# Patient Record
Sex: Female | Born: 1950 | Race: Black or African American | Hispanic: No | Marital: Married | Smoking: Never smoker
Health system: Southern US, Community
[De-identification: ages and names within clinical notes are randomized; demographics above are authoritative.]

## PROBLEM LIST (undated history)

## (undated) DIAGNOSIS — T7840XA Allergy, unspecified, initial encounter: Secondary | ICD-10-CM

## (undated) DIAGNOSIS — M109 Gout, unspecified: Secondary | ICD-10-CM

## (undated) DIAGNOSIS — G4730 Sleep apnea, unspecified: Secondary | ICD-10-CM

## (undated) DIAGNOSIS — J3489 Other specified disorders of nose and nasal sinuses: Secondary | ICD-10-CM

## (undated) DIAGNOSIS — E669 Obesity, unspecified: Secondary | ICD-10-CM

## (undated) DIAGNOSIS — I10 Essential (primary) hypertension: Secondary | ICD-10-CM

## (undated) DIAGNOSIS — E119 Type 2 diabetes mellitus without complications: Secondary | ICD-10-CM

## (undated) HISTORY — PX: KNEE SURGERY: SHX244

## (undated) HISTORY — DX: Obesity, unspecified: E66.9

## (undated) HISTORY — DX: Sleep apnea, unspecified: G47.30

## (undated) HISTORY — DX: Gout, unspecified: M10.9

## (undated) HISTORY — PX: NOSE SURGERY: SHX723

## (undated) HISTORY — DX: Other specified disorders of nose and nasal sinuses: J34.89

## (undated) HISTORY — DX: Allergy, unspecified, initial encounter: T78.40XA

## (undated) HISTORY — PX: COLONOSCOPY: SHX174

## (undated) HISTORY — DX: Essential (primary) hypertension: I10

## (undated) HISTORY — DX: Type 2 diabetes mellitus without complications: E11.9

---

## 1997-08-11 ENCOUNTER — Other Ambulatory Visit: Admission: RE | Admit: 1997-08-11 | Discharge: 1997-08-11 | Payer: Self-pay | Admitting: Obstetrics and Gynecology

## 1998-02-09 ENCOUNTER — Other Ambulatory Visit: Admission: RE | Admit: 1998-02-09 | Discharge: 1998-02-09 | Payer: Self-pay | Admitting: Obstetrics and Gynecology

## 1999-02-15 ENCOUNTER — Other Ambulatory Visit: Admission: RE | Admit: 1999-02-15 | Discharge: 1999-02-15 | Payer: Self-pay | Admitting: Obstetrics and Gynecology

## 2000-03-06 ENCOUNTER — Other Ambulatory Visit: Admission: RE | Admit: 2000-03-06 | Discharge: 2000-03-06 | Payer: Self-pay | Admitting: Obstetrics and Gynecology

## 2000-07-03 ENCOUNTER — Ambulatory Visit (HOSPITAL_COMMUNITY): Admission: RE | Admit: 2000-07-03 | Discharge: 2000-07-03 | Payer: Self-pay | Admitting: Obstetrics and Gynecology

## 2001-04-16 ENCOUNTER — Other Ambulatory Visit: Admission: RE | Admit: 2001-04-16 | Discharge: 2001-04-16 | Payer: Self-pay | Admitting: Obstetrics and Gynecology

## 2002-07-22 ENCOUNTER — Other Ambulatory Visit: Admission: RE | Admit: 2002-07-22 | Discharge: 2002-07-22 | Payer: Self-pay | Admitting: Obstetrics and Gynecology

## 2003-09-16 ENCOUNTER — Other Ambulatory Visit: Admission: RE | Admit: 2003-09-16 | Discharge: 2003-09-16 | Payer: Self-pay | Admitting: Obstetrics and Gynecology

## 2004-10-12 ENCOUNTER — Other Ambulatory Visit: Admission: RE | Admit: 2004-10-12 | Discharge: 2004-10-12 | Payer: Self-pay | Admitting: Obstetrics and Gynecology

## 2005-11-28 ENCOUNTER — Emergency Department (HOSPITAL_COMMUNITY): Admission: EM | Admit: 2005-11-28 | Discharge: 2005-11-28 | Payer: Self-pay | Admitting: Family Medicine

## 2007-11-06 ENCOUNTER — Encounter: Admission: RE | Admit: 2007-11-06 | Discharge: 2007-11-06 | Payer: Self-pay | Admitting: Orthopedic Surgery

## 2009-05-19 ENCOUNTER — Encounter: Admission: RE | Admit: 2009-05-19 | Discharge: 2009-05-19 | Payer: Self-pay | Admitting: Obstetrics and Gynecology

## 2010-02-04 ENCOUNTER — Encounter: Admission: RE | Admit: 2010-02-04 | Discharge: 2010-02-04 | Payer: Self-pay | Admitting: Obstetrics and Gynecology

## 2010-05-03 ENCOUNTER — Emergency Department (HOSPITAL_BASED_OUTPATIENT_CLINIC_OR_DEPARTMENT_OTHER)
Admission: EM | Admit: 2010-05-03 | Discharge: 2010-05-04 | Payer: Self-pay | Source: Home / Self Care | Admitting: Emergency Medicine

## 2010-05-05 LAB — BASIC METABOLIC PANEL
BUN: 13 mg/dL (ref 6–23)
CO2: 31 mEq/L (ref 19–32)
Calcium: 9.2 mg/dL (ref 8.4–10.5)
Chloride: 100 mEq/L (ref 96–112)
Creatinine, Ser: 0.9 mg/dL (ref 0.4–1.2)
GFR calc Af Amer: >60 mL/min (ref >60)
GFR calc non Af Amer: >60 mL/min (ref >60)
Glucose, Bld: 180 mg/dL — ABNORMAL HIGH (ref 70–99)
Potassium: 3.2 mEq/L — ABNORMAL LOW (ref 3.5–5.1)
Sodium: 144 mEq/L (ref 135–145)

## 2010-05-05 LAB — CBC
HCT: 39.3 % (ref 36.0–46.0)
Hemoglobin: 13.3 g/dL (ref 12.0–15.0)
MCH: 28.5 pg (ref 26.0–34.0)
MCHC: 33.8 g/dL (ref 30.0–36.0)
MCV: 84.3 fL (ref 78.0–100.0)
Platelets: 338 10*3/uL (ref 150–400)
RBC: 4.66 MIL/uL (ref 3.87–5.11)
RDW: 12.5 % (ref 11.5–15.5)
WBC: 7.5 10*3/uL (ref 4.0–10.5)

## 2010-09-03 NOTE — H&P (Signed)
[facility]  Patient:    Wendy Ochoa, Wendy Ochoa                       MRN: [id number] Adm. Date:  07/03/00 Attending:  Juluis Mire, M.D.                         History and Physical  HISTORY OF PRESENT ILLNESS:          The patient is a 60 year old gravida 1, para 1, postmenopausal black female who presents for hysteroscopic evaluation.  In relation to the present admission, the patient has a history of amenorrhea. Because of this, she had undergone previous ultrasound and endometrial sampling in 1998. Findings at that time were negative. Because of continued abnormal bleeding, the patient underwent a repeat ultrasound. She had a very thickened endometrium and questionable endometrial polyp. A follow-up saline infusion ultrasound confirmed the presence of multiple polyps. She presents now for hysteroscopic removal and resection.  ALLERGIES:                    The patient has no known drug allergies.  CURRENT MEDICATIONS:          ______ .  PAST MEDICAL HISTORY:         Significant for history of hypertension for which it is controlled nicely with medication. Otherwise, usual childhood diseases without any significant sequelae.  PAST SURGICAL HISTORY:        She has had previous knee surgery. No other surgery noted. She has had one previous spontaneous vaginal delivery.  FAMILY HISTORY:               Noncontributory.  SOCIAL HISTORY:               No tobacco or alcohol use.  REVIEW OF SYSTEMS:            Noncontributory.  PHYSICAL EXAMINATION:  VITAL SIGNS:                  The patient afebrile. Stable vital signs.  HEENT:                        The patient normocephalic. Pupils equal, round, and reactive to light and accommodation. Extraocular movements were intact. Sclerae and conjunctivae clear. Oropharynx clear.  NECK:                         Without thyromegaly.  BREASTS:                      Not examined.  LUNGS:                         Clear.  CARDIAC:                      Regular rate. No murmurs or gallops.  ABDOMEN:                      Benign.  PELVIC:                       Normal external genitalia. Vaginal mucosa is clear. Cervix unremarkable. Uterus normal size and shape and contour. Adnexa free of masses or tenderness.  IMPRESSION:  Endometrial polyps. Rule out endometrial pathology.  PLAN:                         The patient to undergo hysteroscopic evaluation and resection. The risks of surgery have been discussed, including the risks of anesthesia, the risk of infection, the risk of hemorrhage, ______ transfusion with the risk of AIDS or hepatitis, and possible hysterectomy, the risk of uterine perforation that could lead to injury to adjacent organs, ______ laparoscopy and exploratory laparotomy, the risk of DV ______ pulmonary embolus. The patient appears to understand indication and risks. DD: 07/03/00 TD:  07/03/00 Job: [id number]

## 2011-07-07 ENCOUNTER — Other Ambulatory Visit: Payer: Self-pay | Admitting: Obstetrics and Gynecology

## 2011-07-07 DIAGNOSIS — R928 Other abnormal and inconclusive findings on diagnostic imaging of breast: Secondary | ICD-10-CM

## 2011-07-11 ENCOUNTER — Ambulatory Visit
Admission: RE | Admit: 2011-07-11 | Discharge: 2011-07-11 | Disposition: A | Discharge status: Home / Self Care | Payer: BC Managed Care – PPO | Source: Ambulatory Visit | Attending: Obstetrics and Gynecology | Admitting: Obstetrics and Gynecology

## 2011-07-11 DIAGNOSIS — R928 Other abnormal and inconclusive findings on diagnostic imaging of breast: Secondary | ICD-10-CM

## 2012-07-13 ENCOUNTER — Encounter: Payer: Self-pay | Admitting: Internal Medicine

## 2012-09-04 ENCOUNTER — Encounter: Payer: Self-pay | Admitting: Internal Medicine

## 2012-09-04 ENCOUNTER — Ambulatory Visit (AMBULATORY_SURGERY_CENTER): Payer: BC Managed Care – PPO

## 2012-09-04 VITALS — Ht 66.0 in | Wt 235.6 lb

## 2012-09-04 DIAGNOSIS — Z1211 Encounter for screening for malignant neoplasm of colon: Secondary | ICD-10-CM

## 2012-09-04 MED ORDER — MOVIPREP 100 G PO SOLR: ORAL | Status: DC

## 2012-09-18 ENCOUNTER — Encounter: Payer: Self-pay | Admitting: Internal Medicine

## 2012-09-18 ENCOUNTER — Ambulatory Visit (AMBULATORY_SURGERY_CENTER): Payer: BC Managed Care – PPO | Admitting: Internal Medicine

## 2012-09-18 VITALS — BP 111/62 | HR 59 | Temp 97.0°F | Resp 68 | Ht 66.0 in | Wt 235.0 lb

## 2012-09-18 DIAGNOSIS — Z1211 Encounter for screening for malignant neoplasm of colon: Secondary | ICD-10-CM

## 2012-09-18 MED ORDER — SODIUM CHLORIDE 0.9 % IV SOLN: 500.0000 mL | INTRAVENOUS | Status: DC

## 2012-09-18 NOTE — Progress Notes (Signed)
Lidocaine-40mg IV prior to Propofol InductionPropofol given over incremental dosages 

## 2012-09-18 NOTE — Patient Instructions (Addendum)
Discharge instructions given with verbal understanding. Normal exam. resume previous medications. YOU HAD AN ENDOSCOPIC PROCEDURE TODAY AT THE [facility]: Refer to the procedure report that was given to you for any specific questions about what was found during the examination.  If the procedure report does not answer your questions, please call your gastroenterologist to clarify.  If you requested that your care partner not be given the details of your procedure findings, then the procedure report has been included in a sealed envelope for you to review at your convenience later.  YOU SHOULD EXPECT: Some feelings of bloating in the abdomen. Passage of more gas than usual.  Walking can help get rid of the air that was put into your GI tract during the procedure and reduce the bloating. If you had a lower endoscopy (such as a colonoscopy or flexible sigmoidoscopy) you may notice spotting of blood in your stool or on the toilet paper. If you underwent a bowel prep for your procedure, then you may not have a normal bowel movement for a few days.  DIET: Your first meal following the procedure should be a light meal and then it is ok to progress to your normal diet.  A half-sandwich or bowl of soup is an example of a good first meal.  Heavy or fried foods are harder to digest and may make you feel nauseous or bloated.  Likewise meals heavy in dairy and vegetables can cause extra gas to form and this can also increase the bloating.  Drink plenty of fluids but you should avoid alcoholic beverages for 24 hours.  ACTIVITY: Your care partner should take you home directly after the procedure.  You should plan to take it easy, moving slowly for the rest of the day.  You can resume normal activity the day after the procedure however you should NOT DRIVE or use heavy machinery for 24 hours (because of the sedation medicines used during the test).    SYMPTOMS TO REPORT IMMEDIATELY: A gastroenterologist  can be reached at any hour.  During normal business hours, 8:30 AM to 5:00 PM Monday through Friday, call [phone].  After hours and on weekends, please call the GI answering service at [phone] who will take a message and have the physician on call contact you.   Following lower endoscopy (colonoscopy or flexible sigmoidoscopy):  Excessive amounts of blood in the stool  Significant tenderness or worsening of abdominal pains  Swelling of the abdomen that is new, acute  Fever of 100F or higher FOLLOW UP: If any biopsies were taken you will be contacted by phone or by letter within the next 1-3 weeks.  Call your gastroenterologist if you have not heard about the biopsies in 3 weeks.  Our staff will call the home number listed on your records the next business day following your procedure to check on you and address any questions or concerns that you may have at that time regarding the information given to you following your procedure. This is a courtesy call and so if there is no answer at the home number and we have not heard from you through the emergency physician on call, we will assume that you have returned to your regular daily activities without incident.  SIGNATURES/CONFIDENTIALITY: You and/or your care partner have signed paperwork which will be entered into your electronic medical record.  These signatures attest to the fact that that the information above on your After Visit Summary has been reviewed and  is understood.  Full responsibility of the confidentiality of this discharge information lies with you and/or your care-partner.

## 2012-09-18 NOTE — Op Note (Addendum)
[facility] [address] [place], [address]   COLONOSCOPY PROCEDURE REPORT  PATIENT: Wendy, Ochoa  MR#: [id number] BIRTHDATE: May 25, 1950 , 61  yrs. old GENDER: Female ENDOSCOPIST: Hart Carwin, MD REFERRED BY:  Deatra James, M.D. , Dr Shela Commons.McComb PROCEDURE DATE:  09/18/2012 PROCEDURE:       screening colonoscopy ASA CLASS:   II INDICATIONS:screening, average risk, last colonoscopy 2004 MEDICATIONS: MAC, Propofol 250 mg IV  DESCRIPTION OF PROCEDURE:   After the risks and benefits and of the procedure were explained, informed consent was obtained.        The LB [id number]  endoscope was introduced through the anus and advanced to the    .  The quality of the prep was    .  The instrument was then slowly withdrawn as the colon was fully examined.   Normal exam,, no diverticuli or polyps, [   The scope was then withdrawn from the patient and the procedure completed.  COMPLICATIONS: There were no complications. ENDOSCOPIC IMPRESSION: normal colonoscopy to the cecum RECOMMENDATIONS: High fiber diet  REPEAT EXAM: recall colonoscopy 10 years  cc:  _______________________________ eSignedHart Carwin, MD 09/18/2012 9:55 AM Revised: 09/18/2012 9:55 AM

## 2012-09-18 NOTE — Progress Notes (Signed)
Ambulated in recovery room times five laps. Stated i feel better. Expel moderate amount on air while in recovery. Stated ready to go home.

## 2012-09-18 NOTE — Progress Notes (Signed)
Patient did not experience any of the following events: a burn prior to discharge; a fall within the facility; wrong site/side/patient/procedure/implant event; or a hospital transfer or hospital admission upon discharge from the facility. (G8907) Patient did not have preoperative order for IV antibiotic SSI prophylaxis. (G8918)  

## 2012-09-19 ENCOUNTER — Telehealth: Payer: Self-pay | Admitting: *Deleted

## 2012-09-19 NOTE — Telephone Encounter (Signed)
  Follow up Call-  Call back number 09/18/2012  Post procedure Call Back phone  # [phone]  Permission to leave phone message Yes     Patient questions:  Do you have a fever, pain , or abdominal swelling? no Pain Score  0 *  Have you tolerated food without any problems? yes  Have you been able to return to your normal activities? yes  Do you have any questions about your discharge instructions: Diet   no Medications  no Follow up visit  no  Do you have questions or concerns about your Care? no  Actions: * If pain score is 4 or above: No action needed, pain <4.

## 2013-02-12 ENCOUNTER — Inpatient Hospital Stay (HOSPITAL_COMMUNITY)
Admission: EM | Admit: 2013-02-12 | Discharge: 2013-02-14 | DRG: 287 | Disposition: A | Discharge status: Home / Self Care | Payer: BC Managed Care – PPO | Attending: Internal Medicine | Admitting: Internal Medicine

## 2013-02-12 ENCOUNTER — Emergency Department (HOSPITAL_COMMUNITY): Payer: BC Managed Care – PPO

## 2013-02-12 ENCOUNTER — Encounter (HOSPITAL_COMMUNITY): Payer: Self-pay | Admitting: Emergency Medicine

## 2013-02-12 DIAGNOSIS — R079 Chest pain, unspecified: Principal | ICD-10-CM | POA: Diagnosis present

## 2013-02-12 DIAGNOSIS — R9439 Abnormal result of other cardiovascular function study: Secondary | ICD-10-CM | POA: Diagnosis present

## 2013-02-12 DIAGNOSIS — E876 Hypokalemia: Secondary | ICD-10-CM | POA: Diagnosis present

## 2013-02-12 DIAGNOSIS — E781 Pure hyperglyceridemia: Secondary | ICD-10-CM | POA: Diagnosis present

## 2013-02-12 DIAGNOSIS — E669 Obesity, unspecified: Secondary | ICD-10-CM | POA: Diagnosis present

## 2013-02-12 DIAGNOSIS — R001 Bradycardia, unspecified: Secondary | ICD-10-CM

## 2013-02-12 DIAGNOSIS — M549 Dorsalgia, unspecified: Secondary | ICD-10-CM | POA: Diagnosis present

## 2013-02-12 DIAGNOSIS — IMO0002 Reserved for concepts with insufficient information to code with codable children: Secondary | ICD-10-CM

## 2013-02-12 DIAGNOSIS — I10 Essential (primary) hypertension: Secondary | ICD-10-CM | POA: Diagnosis present

## 2013-02-12 DIAGNOSIS — G4733 Obstructive sleep apnea (adult) (pediatric): Secondary | ICD-10-CM | POA: Diagnosis present

## 2013-02-12 DIAGNOSIS — Z79899 Other long term (current) drug therapy: Secondary | ICD-10-CM

## 2013-02-12 DIAGNOSIS — M109 Gout, unspecified: Secondary | ICD-10-CM | POA: Diagnosis present

## 2013-02-12 LAB — CBC
HCT: 43.4 % (ref 36.0–46.0)
Hemoglobin: 14.7 g/dL (ref 12.0–15.0)
MCH: 29.6 pg (ref 26.0–34.0)
MCV: 87.5 fL (ref 78.0–100.0)
RDW: 13.2 % (ref 11.5–15.5)
WBC: 12.8 10*3/uL — ABNORMAL HIGH (ref 4.0–10.5)

## 2013-02-12 LAB — CBC WITH DIFFERENTIAL/PLATELET
Basophils Absolute: 0 10*3/uL (ref 0.0–0.1)
Basophils Relative: 0 % (ref 0–1)
Eosinophils Absolute: 0.1 10*3/uL (ref 0.0–0.7)
Eosinophils Relative: 1 % (ref 0–5)
HCT: 42.9 % (ref 36.0–46.0)
Hemoglobin: 14.8 g/dL (ref 12.0–15.0)
Lymphocytes Relative: 54 % — ABNORMAL HIGH (ref 12–46)
Lymphs Abs: 7.8 10*3/uL — ABNORMAL HIGH (ref 0.7–4.0)
MCH: 29.9 pg (ref 26.0–34.0)
MCHC: 34.5 g/dL (ref 30.0–36.0)
MCV: 86.7 fL (ref 78.0–100.0)
Monocytes Absolute: 0.9 10*3/uL (ref 0.1–1.0)
Monocytes Relative: 6 % (ref 3–12)
Neutro Abs: 5.7 10*3/uL (ref 1.7–7.7)
Neutrophils Relative %: 39 % — ABNORMAL LOW (ref 43–77)
Platelets: 349 10*3/uL (ref 150–400)
RBC: 4.95 MIL/uL (ref 3.87–5.11)
RDW: 13.1 % (ref 11.5–15.5)
WBC: 14.5 10*3/uL — ABNORMAL HIGH (ref 4.0–10.5)

## 2013-02-12 LAB — POCT I-STAT, CHEM 8
BUN: 18 mg/dL (ref 6–23)
Calcium, Ion: 1.19 mmol/L (ref 1.13–1.30)
Chloride: 96 mEq/L (ref 96–112)
HCT: 48 % — ABNORMAL HIGH (ref 36.0–46.0)
Potassium: 2.9 mEq/L — ABNORMAL LOW (ref 3.5–5.1)
Sodium: 143 mEq/L (ref 135–145)
TCO2: 31 mmol/L (ref 0–100)

## 2013-02-12 LAB — LIPID PANEL
Cholesterol: 173 mg/dL (ref 0–200)
HDL: 37 mg/dL — ABNORMAL LOW (ref >39)
LDL Cholesterol: 98 mg/dL (ref 0–99)

## 2013-02-12 LAB — CREATININE, SERUM
Creatinine, Ser: 0.93 mg/dL (ref 0.50–1.10)
GFR calc Af Amer: 75 mL/min — ABNORMAL LOW (ref >90)

## 2013-02-12 LAB — POCT I-STAT TROPONIN I: Troponin i, poc: 0.01 ng/mL (ref 0.00–0.08)

## 2013-02-12 LAB — PROTIME-INR
INR: 0.97 (ref 0.00–1.49)
Prothrombin Time: 12.7 seconds (ref 11.6–15.2)

## 2013-02-12 LAB — POTASSIUM: Potassium: 2.7 mEq/L — CL (ref 3.5–5.1)

## 2013-02-12 MED ORDER — SODIUM CHLORIDE 0.9 % IV SOLN
INTRAVENOUS | Status: DC
  Administered 2013-02-12: 22:00:00 via INTRAVENOUS

## 2013-02-12 MED ORDER — ACETAMINOPHEN 325 MG PO TABS: 650.0000 mg | ORAL_TABLET | ORAL | Status: DC | PRN

## 2013-02-12 MED ORDER — ALLOPURINOL 100 MG PO TABS
100.0000 mg | ORAL_TABLET | Freq: Every day | ORAL | Status: DC | PRN
  Filled 2013-02-12: qty 1

## 2013-02-12 MED ORDER — POLYVINYL ALCOHOL 1.4 % OP SOLN
1.0000 [drp] | Freq: Four times a day (QID) | OPHTHALMIC | Status: DC
  Administered 2013-02-14: 1 [drp] via OPHTHALMIC
  Filled 2013-02-12: qty 15

## 2013-02-12 MED ORDER — CHLORTHALIDONE 25 MG PO TABS
25.0000 mg | ORAL_TABLET | Freq: Every day | ORAL | Status: DC
  Administered 2013-02-13 – 2013-02-14 (×2): 25 mg via ORAL
  Filled 2013-02-12 (×2): qty 1

## 2013-02-12 MED ORDER — POTASSIUM CHLORIDE 10 MEQ/100ML IV SOLN
10.0000 meq | INTRAVENOUS | Status: AC
  Administered 2013-02-12 – 2013-02-13 (×3): 10 meq via INTRAVENOUS
  Filled 2013-02-12 (×3): qty 100

## 2013-02-12 MED ORDER — ATENOLOL 50 MG PO TABS
50.0000 mg | ORAL_TABLET | Freq: Every day | ORAL | Status: DC
  Administered 2013-02-13 – 2013-02-14 (×2): 50 mg via ORAL
  Filled 2013-02-12 (×2): qty 1

## 2013-02-12 MED ORDER — ASPIRIN 300 MG RE SUPP
300.0000 mg | RECTAL | Status: AC
  Filled 2013-02-12: qty 1

## 2013-02-12 MED ORDER — COLCHICINE 0.6 MG PO TABS
0.6000 mg | ORAL_TABLET | Freq: Every day | ORAL | Status: DC | PRN
  Filled 2013-02-12: qty 1

## 2013-02-12 MED ORDER — ATENOLOL-CHLORTHALIDONE 50-25 MG PO TABS: 1.0000 | ORAL_TABLET | Freq: Every day | ORAL | Status: DC

## 2013-02-12 MED ORDER — ONDANSETRON HCL 4 MG/2ML IJ SOLN: 4.0000 mg | Freq: Four times a day (QID) | INTRAMUSCULAR | Status: DC | PRN

## 2013-02-12 MED ORDER — ONDANSETRON HCL 4 MG/2ML IJ SOLN: 4.0000 mg | Freq: Three times a day (TID) | INTRAMUSCULAR | Status: AC | PRN

## 2013-02-12 MED ORDER — MOMETASONE FURO-FORMOTEROL FUM 100-5 MCG/ACT IN AERO
2.0000 | INHALATION_SPRAY | Freq: Two times a day (BID) | RESPIRATORY_TRACT | Status: DC
  Administered 2013-02-12 – 2013-02-13 (×3): 2 via RESPIRATORY_TRACT
  Filled 2013-02-12: qty 8.8

## 2013-02-12 MED ORDER — ASPIRIN EC 81 MG PO TBEC
81.0000 mg | DELAYED_RELEASE_TABLET | Freq: Every day | ORAL | Status: DC
  Administered 2013-02-13 – 2013-02-14 (×2): 81 mg via ORAL
  Filled 2013-02-12 (×2): qty 1

## 2013-02-12 MED ORDER — POTASSIUM CHLORIDE CRYS ER 20 MEQ PO TBCR
40.0000 meq | EXTENDED_RELEASE_TABLET | Freq: Once | ORAL | Status: AC
  Administered 2013-02-12: 40 meq via ORAL
  Filled 2013-02-12: qty 2

## 2013-02-12 MED ORDER — NITROGLYCERIN 0.4 MG SL SUBL: 0.4000 mg | SUBLINGUAL_TABLET | SUBLINGUAL | Status: DC | PRN

## 2013-02-12 MED ORDER — CARBOXYMETHYLCELLUL-GLYCERIN 1-0.25 % OP SOLN: 1.0000 [drp] | Freq: Four times a day (QID) | OPHTHALMIC | Status: DC

## 2013-02-12 MED ORDER — ENOXAPARIN SODIUM 40 MG/0.4ML ~~LOC~~ SOLN
40.0000 mg | SUBCUTANEOUS | Status: DC
  Administered 2013-02-12 – 2013-02-13 (×2): 40 mg via SUBCUTANEOUS
  Filled 2013-02-12 (×3): qty 0.4

## 2013-02-12 MED ORDER — ASPIRIN 81 MG PO CHEW: 324.0000 mg | CHEWABLE_TABLET | ORAL | Status: AC

## 2013-02-12 NOTE — Progress Notes (Signed)
Pt refuses to wear CPAP for tonight. Pt does not wear every night at home. No distress noted. Pt encouraged to call RT if pt changes mind.
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
## 2013-02-12 NOTE — ED Notes (Addendum)
Pt reports mid sternum chest pressure x1 week, seen at UC diagnosed with Bronchitis prescribed a loading dose of Prednisone ans Zithromax. Pt reports she began experiencing mid-sternum chest pressure that radiated to her back when coughing, unable to catch her breath when lying flat states it would cause increase pressure, weakness, and non productive cough on Saturday evening that has continued to get progressively worse. Pt seen at her PCP today, they sent her here for further evaluation due to no old EKG

## 2013-02-12 NOTE — ED Provider Notes (Addendum)
62 year old female, history of hypertension, history of family member with early cardiac disease and early cardiac death at age 22 from a massive heart attack, both parents with heart disease. She presents at the request of her physician from the physician's office with complaint of intermittent heaviness on her chest, she is obese, lungs are clear, heart is regular, soft murmur, no peripheral edema. Her EKG shows sinus bradycardia with no signs of significant ischemia, the patient is on a beta blocker. Due to her intermittent symptoms, heaviness sensation she will need admission to the hospital for cardiac evaluation and rule out. She has never had any cardiac testing in the past.  Troponin negative, discussed with Dr. Joseph Art of the hospitalist service will admit.    Medical screening examination/treatment/procedure(s) were conducted as a shared visit with non-physician practitioner(s) and myself.  I personally evaluated the patient during the encounter.  Clinical Impression:  Chest pain      Vida Roller, MD 02/12/13 1415  Vida Roller, MD 02/12/13 [phone]

## 2013-02-12 NOTE — ED Notes (Signed)
Pt arrived by [facility] from MD office with c/o CP. Denies any n/v but stated that she did have a little bit of SOB. Pt has received ASA324mg  and Nitro x1. Pt was initially 2/10 pain and after Nitro is now free from pain. Pt has had bronchitis and is now finishing up tx for that. 12 lead showed Sinus Huston Foley.

## 2013-02-12 NOTE — Plan of Care (Signed)
Problem: Phase I Progression Outcomes Goal: Aspirin unless contraindicated Outcome: Completed/Met Date Met:  02/12/13 Pt reports taking pta

## 2013-02-13 DIAGNOSIS — R072 Precordial pain: Secondary | ICD-10-CM

## 2013-02-13 LAB — TROPONIN I
Troponin I: <0.3 ng/mL
Troponin I: <0.3 ng/mL

## 2013-02-13 LAB — COMPREHENSIVE METABOLIC PANEL
ALT: 11 U/L (ref 0–35)
AST: 13 U/L (ref 0–37)
Albumin: 3.1 g/dL — ABNORMAL LOW (ref 3.5–5.2)
Alkaline Phosphatase: 104 U/L (ref 39–117)
Calcium: 8.9 mg/dL (ref 8.4–10.5)
Creatinine, Ser: 0.97 mg/dL (ref 0.50–1.10)
GFR calc Af Amer: 72 mL/min — ABNORMAL LOW (ref >90)
Glucose, Bld: 109 mg/dL — ABNORMAL HIGH (ref 70–99)
Potassium: 3.3 mEq/L — ABNORMAL LOW (ref 3.5–5.1)
Sodium: 139 mEq/L (ref 135–145)
Total Protein: 7.7 g/dL (ref 6.0–8.3)

## 2013-02-13 LAB — TSH: TSH: 0.965 u[IU]/mL (ref 0.350–4.500)

## 2013-02-13 LAB — MAGNESIUM: Magnesium: 1.8 mg/dL (ref 1.5–2.5)

## 2013-02-13 LAB — PRO B NATRIURETIC PEPTIDE: Pro B Natriuretic peptide (BNP): 65.4 pg/mL (ref 0–125)

## 2013-02-13 LAB — HEMOGLOBIN A1C
Hgb A1c MFr Bld: 6.5 % — ABNORMAL HIGH
Mean Plasma Glucose: 140 mg/dL — ABNORMAL HIGH

## 2013-02-13 LAB — T4, FREE: Free T4: 1.29 ng/dL (ref 0.80–1.80)

## 2013-02-13 MED ORDER — POTASSIUM CHLORIDE CRYS ER 20 MEQ PO TBCR
40.0000 meq | EXTENDED_RELEASE_TABLET | Freq: Once | ORAL | Status: AC
  Administered 2013-02-13: 40 meq via ORAL
  Filled 2013-02-13: qty 2

## 2013-02-13 MED ORDER — PANTOPRAZOLE SODIUM 40 MG PO TBEC
40.0000 mg | DELAYED_RELEASE_TABLET | Freq: Two times a day (BID) | ORAL | Status: DC
  Administered 2013-02-13 – 2013-02-14 (×2): 40 mg via ORAL
  Filled 2013-02-13 (×2): qty 1
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
## 2013-02-13 NOTE — Progress Notes (Signed)
[facility] PROGRESS NOTE  JUN RIGHTMYER GNF:[id number] DOB: Feb 11, 1951 DOA: 02/12/2013 PCP: Leanor Rubenstein, MD  Assessment/Plan: 1. Chest pain; pressure, patient with typical symptoms, multiple risk factors age, history of hypertension, obesity. EKG with T wave abnormalities, junctional rhythm. I have consulted cardio for evaluation for impatient stress test. Continue with aspirin, nitroglycerin. Will add protonix.  2. Hypokalemia: replete with 40 meq times 2.  3. Hypertension: continue with atenolol, Chlorthalidone.   Code Status: Full Code.  Family Communication: care discussed with patient.  Disposition Plan: home when stable.    Consultants:  Cardio  Procedures:  ECHO pending.   Antibiotics:  none  HPI/Subjective: Patient relates chest pressure for last week. Chest pressure is worse when she ly down.  She was treated last week for bronchitis.  She denies chest pain on exertion.   Objective: Filed Vitals:   02/13/13 1252  BP: 136/62  Pulse: 69  Temp: 97.9 F (36.6 C)  Resp: 16    Intake/Output Summary (Last 24 hours) at 02/13/13 1432 Last data filed at 02/13/13 1200  Gross per 24 hour  Intake    360 ml  Output      0 ml  Net    360 ml   Filed Weights   02/12/13 2151  Weight: 108.001 kg (238 lb 1.6 oz)    Exam:   General:  No distress.   Cardiovascular: S 1, S 2 RRR  Respiratory: CTA  Abdomen: BS present, soft, NT  Musculoskeletal: no edema.   Data Reviewed: Basic Metabolic Panel:  Recent Labs Lab 02/12/13 1536 02/12/13 2117 02/12/13 2220 02/13/13 0610  NA 143  --   --  139  K 2.9*  --  2.7* 3.3*  CL 96  --   --  99  CO2  --   --   --  31  GLUCOSE 81  --   --  109*  BUN 18  --   --  16  CREATININE 1.20* 0.93  --  0.97  CALCIUM  --   --   --  8.9  MG  --   --  1.8 1.8   Liver Function Tests:  Recent Labs Lab 02/13/13 0610  AST 13  ALT 11  ALKPHOS 104  BILITOT 0.5  PROT 7.7  ALBUMIN 3.1*   No results found for  this basename: LIPASE, AMYLASE,  in the last 168 hours No results found for this basename: AMMONIA,  in the last 168 hours CBC:  Recent Labs Lab 02/12/13 1343 02/12/13 1536 02/12/13 2117  WBC 14.5*  --  12.8*  NEUTROABS 5.7  --   --   HGB 14.8 16.3* 14.7  HCT 42.9 48.0* 43.4  MCV 86.7  --  87.5  PLT 349  --  358   Cardiac Enzymes:  Recent Labs Lab 02/12/13 2300 02/13/13 0610 02/13/13 1205  TROPONINI <0.30 <0.30 <0.30   BNP (last 3 results)  Recent Labs  02/12/13 2300  PROBNP 65.4   CBG: No results found for this basename: GLUCAP,  in the last 168 hours  No results found for this or any previous visit (from the past 240 hour(s)).   Studies: Dg Chest Portable 1 View  02/12/2013   CLINICAL DATA:  Chest pressure, left upper back pain, shortness of breath, cough  EXAM: PORTABLE CHEST - 1 VIEW  COMPARISON:  05/03/2010  FINDINGS: The heart size and mediastinal contours are within normal limits. Both lungs are clear. The visualized skeletal structures are unremarkable.  IMPRESSION: No active disease.   Electronically Signed   By: Ruel Favors M.D.   On: 02/12/2013 14:32    Scheduled Meds: . aspirin  324 mg Oral NOW   Or  . aspirin  300 mg Rectal NOW  . aspirin EC  81 mg Oral Daily  . atenolol  50 mg Oral Daily   And  . chlorthalidone  25 mg Oral Daily  . enoxaparin (LOVENOX) injection  40 mg Subcutaneous Q24H  . mometasone-formoterol  2 puff Inhalation BID  . polyvinyl alcohol  1 drop Both Eyes QID   Continuous Infusions:   Principal Problem:   Chest pain Active Problems:   OSA on CPAP   Gout   HTN (hypertension)    Time spent: 25 minutes.     ,  [facility] Pager [phone]. If 7PM-7AM, please contact night-coverage at [email or web address], password [facility] 02/13/2013, 2:32 PM  LOS: 1 day

## 2013-02-13 NOTE — Progress Notes (Signed)
*  PRELIMINARY RESULTS* Echocardiogram 2D Echocardiogram has been performed.  Wendy Ochoa 02/13/2013, 3:07 PM

## 2013-02-14 ENCOUNTER — Inpatient Hospital Stay (HOSPITAL_COMMUNITY): Payer: BC Managed Care – PPO

## 2013-02-14 ENCOUNTER — Encounter (HOSPITAL_COMMUNITY)
Admission: EM | Disposition: A | Discharge status: Home / Self Care | Payer: Self-pay | Source: Home / Self Care | Attending: Internal Medicine

## 2013-02-14 DIAGNOSIS — R079 Chest pain, unspecified: Secondary | ICD-10-CM

## 2013-02-14 DIAGNOSIS — R9439 Abnormal result of other cardiovascular function study: Secondary | ICD-10-CM | POA: Diagnosis present

## 2013-02-14 HISTORY — PX: LEFT HEART CATHETERIZATION WITH CORONARY ANGIOGRAM: SHX5451

## 2013-02-14 LAB — COMPREHENSIVE METABOLIC PANEL
Alkaline Phosphatase: 96 U/L (ref 39–117)
BUN: 16 mg/dL (ref 6–23)
Calcium: 8.7 mg/dL (ref 8.4–10.5)
Chloride: 99 mEq/L (ref 96–112)
GFR calc Af Amer: 69 mL/min — ABNORMAL LOW (ref >90)
Glucose, Bld: 140 mg/dL — ABNORMAL HIGH (ref 70–99)
Potassium: 3.8 mEq/L (ref 3.5–5.1)
Total Bilirubin: 0.5 mg/dL (ref 0.3–1.2)
Total Protein: 6.9 g/dL (ref 6.0–8.3)

## 2013-02-14 LAB — MAGNESIUM: Magnesium: 1.9 mg/dL (ref 1.5–2.5)

## 2013-02-14 SURGERY — LEFT HEART CATHETERIZATION WITH CORONARY ANGIOGRAM
Anesthesia: LOCAL

## 2013-02-14 MED ORDER — HEPARIN SODIUM (PORCINE) 1000 UNIT/ML IJ SOLN
INTRAMUSCULAR | Status: AC
  Filled 2013-02-14: qty 1

## 2013-02-14 MED ORDER — SODIUM CHLORIDE 0.9 % IJ SOLN: 3.0000 mL | INTRAMUSCULAR | Status: DC | PRN

## 2013-02-14 MED ORDER — ACETAMINOPHEN 325 MG PO TABS: 650.0000 mg | ORAL_TABLET | ORAL | Status: DC | PRN

## 2013-02-14 MED ORDER — HEPARIN (PORCINE) IN NACL 2-0.9 UNIT/ML-% IJ SOLN
INTRAMUSCULAR | Status: AC
  Filled 2013-02-14: qty 1000

## 2013-02-14 MED ORDER — REGADENOSON 0.4 MG/5ML IV SOLN
0.4000 mg | Freq: Once | INTRAVENOUS | Status: AC
  Administered 2013-02-14: 0.4 mg via INTRAVENOUS

## 2013-02-14 MED ORDER — FENTANYL CITRATE 0.05 MG/ML IJ SOLN
INTRAMUSCULAR | Status: AC
  Filled 2013-02-14: qty 2

## 2013-02-14 MED ORDER — REGADENOSON 0.4 MG/5ML IV SOLN
INTRAVENOUS | Status: AC
  Administered 2013-02-14: 10:00:00
  Filled 2013-02-14: qty 5

## 2013-02-14 MED ORDER — MIDAZOLAM HCL 2 MG/2ML IJ SOLN
INTRAMUSCULAR | Status: AC
  Filled 2013-02-14: qty 2

## 2013-02-14 MED ORDER — PANTOPRAZOLE SODIUM 40 MG PO TBEC: 40.0000 mg | DELAYED_RELEASE_TABLET | Freq: Two times a day (BID) | ORAL | Status: DC

## 2013-02-14 MED ORDER — SODIUM CHLORIDE 0.9 % IV SOLN: 250.0000 mL | INTRAVENOUS | Status: DC | PRN

## 2013-02-14 MED ORDER — ASPIRIN 81 MG PO TBEC: 81.0000 mg | DELAYED_RELEASE_TABLET | Freq: Every day | ORAL | Status: DC

## 2013-02-14 MED ORDER — NITROGLYCERIN 0.2 MG/ML ON CALL CATH LAB
INTRAVENOUS | Status: AC
  Filled 2013-02-14: qty 1

## 2013-02-14 MED ORDER — TECHNETIUM TC 99M SESTAMIBI GENERIC - CARDIOLITE
30.0000 | Freq: Once | INTRAVENOUS | Status: AC | PRN
  Administered 2013-02-14: 30 via INTRAVENOUS

## 2013-02-14 MED ORDER — VERAPAMIL HCL 2.5 MG/ML IV SOLN
INTRAVENOUS | Status: AC
  Filled 2013-02-14: qty 2

## 2013-02-14 MED ORDER — TECHNETIUM TC 99M SESTAMIBI GENERIC - CARDIOLITE
10.0000 | Freq: Once | INTRAVENOUS | Status: AC | PRN
  Administered 2013-02-14: 10 via INTRAVENOUS

## 2013-02-14 MED ORDER — SODIUM CHLORIDE 0.9 % IJ SOLN
3.0000 mL | Freq: Two times a day (BID) | INTRAMUSCULAR | Status: DC
  Administered 2013-02-14: 3 mL via INTRAVENOUS

## 2013-02-14 MED ORDER — ONDANSETRON HCL 4 MG/2ML IJ SOLN: 4.0000 mg | Freq: Four times a day (QID) | INTRAMUSCULAR | Status: DC | PRN

## 2013-02-14 MED ORDER — SODIUM CHLORIDE 0.9 % IV SOLN
INTRAVENOUS | Status: DC
  Administered 2013-02-14: 14:00:00 via INTRAVENOUS

## 2013-02-14 MED ORDER — LIDOCAINE HCL (PF) 1 % IJ SOLN
INTRAMUSCULAR | Status: AC
  Filled 2013-02-14: qty 30

## 2013-02-14 NOTE — Interval H&P Note (Signed)
Cath Lab Visit (complete for each Cath Lab visit)  Clinical Evaluation Leading to the Procedure:   ACS: no  Non-ACS:    Anginal Classification: CCS II  Anti-ischemic medical therapy: No Therapy  Non-Invasive Test Results: Low-risk stress test findings: cardiac mortality <1%/year  Prior CABG: No previous CABG      History and Physical Interval Note:  02/14/2013 4:32 PM  Leslyn C Fason  has presented today for surgery, with the diagnosis of Abnormal Nuc.  The various methods of treatment have been discussed with the patient and family. After consideration of risks, benefits and other options for treatment, the patient has consented to  Procedure(s): LEFT HEART CATHETERIZATION WITH CORONARY ANGIOGRAM (N/A) as a surgical intervention .  The patient's history has been reviewed, patient examined, no change in status, stable for surgery.  I have reviewed the patient's chart and labs.  Questions were answered to the patient's satisfaction.     Lesleigh Noe

## 2013-02-14 NOTE — CV Procedure (Signed)
     Left Heart Catheterization with Coronary Angiography  Report  Wendy Ochoa  62 y.o.  female Sep 14, 1950  Procedure Date: 02/14/2013  Referring Physician: Lonia Farber, MD Primary Cardiologist:: J.Varanasi, MD  INDICATIONS: The recurring episodes of chest pressure occurring at rest without EKG or enzyme marker abnormality. Low to intermediate risk abnormal myocardial perfusion study earlier today suggesting apical ischemia.  PROCEDURE: 1. Left heart catheterization; 2. Coronary angiography; 3. Left ventriculography.  CONSENT:  The risks, benefits, and details of the procedure were explained in detail to the patient. Risks including death, stroke, heart attack, kidney injury, allergy, limb ischemia, bleeding and radiation injury were discussed.  The patient verbalized understanding and wanted to proceed.  Informed written consent was obtained.  PROCEDURE TECHNIQUE:  After Xylocaine anesthesia a 5 French Slender sheath was placed in the right radial artery with a single anterior needle wall stick.  Coronary angiography was done using a 5 [place] JR 4 and JL 3.5 cm catheter.  Left ventriculography was done using the JR 4 catheter and hand injection.   No difficulties encountered during the procedure.  MEDICATIONS: Versed 1 mg; fentanyl 50 mcg; aspirin.   EQUIPMENT: Diagnostic 5 French Judkins 4 cm right and 3.5 cm left. Versed 4 steerable guidewire and 300 cm tight J-wire.  CONTRAST:  Total of 60 cc.  COMPLICATIONS:  None   HEMODYNAMICS:  Aortic pressure 142/67; LV pressure 147/7; LVEDP 13  ANGIOGRAPHIC DATA:   The left main coronary artery is normal.  The left anterior descending artery is transapical and normal distal tortuosity noted.  The left circumflex artery is normal.  The right coronary artery is normal.   LEFT VENTRICULOGRAM:  Left ventricular angiogram was done in the 30 RAO projection and revealed no regional wall motion abnormality. EF 65%.   IMPRESSIONS:   1. Widely patent coronary arteries including the LAD which supplies the apex.  2. Normal left ventricular systolic function  3. False positive myocardial perfusion study with the apical perfusion abnormality likely secondary to breast attenuation   RECOMMENDATION:  Home later tonight if no problems with radial access site.Marland Kitchen

## 2013-02-14 NOTE — ED Provider Notes (Signed)
Medical screening examination/treatment/procedure(s) were conducted as a shared visit with non-physician practitioner(s) and myself.  I personally evaluated the patient during the encounter  Please see my separate respective documentation pertaining to this patient encounter   Vida Roller, MD 02/14/13 1524

## 2013-02-14 NOTE — Progress Notes (Signed)
[facility] PROGRESS NOTE  Wendy Ochoa FAO:[id number] DOB: [birth year] DOA: 02/12/2013 PCP: Leanor Rubenstein, MD  Assessment/Plan: 1. Chest pain; pressure, patient with typical symptoms, multiple risk factors age, history of hypertension, obesity. EKG with T wave abnormalities, junctional rhythm.  Continue with aspirin, nitroglycerin,  protonix. Myoview with perfusion defect. For cath today.  2. Hypokalemia: resolved.  3. Hypertension: continue with atenolol, Chlorthalidone.   Code Status: Full Code.  Family Communication: care discussed with patient.  Disposition Plan: home when stable.    Consultants:  Cardio  Procedures:  ECHO pending.   Antibiotics:  none  HPI/Subjective: Feeling better. Some fullness after she eats.   Objective: Filed Vitals:   02/14/13 1423  BP: 145/67  Pulse: 63  Temp: 97.9 F (36.6 C)  Resp: 16    Intake/Output Summary (Last 24 hours) at 02/14/13 1639 Last data filed at 02/14/13 1200  Gross per 24 hour  Intake    360 ml  Output      0 ml  Net    360 ml   Filed Weights   02/12/13 2151  Weight: 108.001 kg (238 lb 1.6 oz)    Exam:   General:  No distress.   Cardiovascular: S 1, S 2 RRR  Respiratory: CTA  Abdomen: BS present, soft, NT  Musculoskeletal: no edema.   Data Reviewed: Basic Metabolic Panel:  Recent Labs Lab 02/12/13 1536 02/12/13 2117 02/12/13 2220 02/13/13 0610 02/14/13 0401  NA 143  --   --  139 138  K 2.9*  --  2.7* 3.3* 3.8  CL 96  --   --  99 99  CO2  --   --   --  31 31  GLUCOSE 81  --   --  109* 140*  BUN 18  --   --  16 16  CREATININE 1.20* 0.93  --  0.97 1.00  CALCIUM  --   --   --  8.9 8.7  MG  --   --  1.8 1.8 1.9   Liver Function Tests:  Recent Labs Lab 02/13/13 0610 02/14/13 0401  AST 13 10  ALT 11 10  ALKPHOS 104 96  BILITOT 0.5 0.5  PROT 7.7 6.9  ALBUMIN 3.1* 2.7*   No results found for this basename: LIPASE, AMYLASE,  in the last 168 hours No results found for this  basename: AMMONIA,  in the last 168 hours CBC:  Recent Labs Lab 02/12/13 1343 02/12/13 1536 02/12/13 2117  WBC 14.5*  --  12.8*  NEUTROABS 5.7  --   --   HGB 14.8 16.3* 14.7  HCT 42.9 48.0* 43.4  MCV 86.7  --  87.5  PLT 349  --  358   Cardiac Enzymes:  Recent Labs Lab 02/12/13 2300 02/13/13 0610 02/13/13 1205  TROPONINI <0.30 <0.30 <0.30   BNP (last 3 results)  Recent Labs  02/12/13 2300  PROBNP 65.4   CBG: No results found for this basename: GLUCAP,  in the last 168 hours  No results found for this or any previous visit (from the past 240 hour(s)).   Studies: Nm Myocar Multi W/spect W/wall Motion / Ef  02/14/2013   CLINICAL DATA:  Chest pain. History of hypertension and gout.  The  EXAM: MYOCARDIAL IMAGING WITH SPECT (REST AND PHARMACOLOGIC-STRESS)  GATED LEFT VENTRICULAR WALL MOTION STUDY  LEFT VENTRICULAR EJECTION FRACTION  TECHNIQUE: Standard myocardial SPECT imaging was performed after resting intravenous injection of 10 mCi Tc-60m sestamibi. Subsequently, intravenous infusion of Lexiscan  was performed under the supervision of the Cardiology staff. At peak effect of the drug, 30 mCi Tc-60m sestamibi was injected intravenously and standard myocardial SPECT imaging was performed. Quantitative gated imaging was also performed to evaluate left ventricular wall motion, and estimate left ventricular ejection fraction.  COMPARISON:  Chest CTA 05/03/2010.  FINDINGS: SPECT images demonstrated mildly decreased activity within the apex and inferior wall. This appears largely fixed, although a small amount of reversibility at the apex cannot be excluded. The summed different score is 5.  The gated images were reviewed and demonstrate normal left ventricular wall motion and systolic thickening. The QGS ejection fraction calculated at rest is 60% with an end-diastolic volume of 82 ml and an end-systolic volume of 33ml.  IMPRESSION: 1. Largely fixed perfusion defect at the apex and  inferior wall may be related to myocardial thinning. However, a small amount of reversibility at the apex is difficult to exclude. No other reversible perfusion defects. 2. Normal wall motion study with calculated ejection fraction of 60%.   Electronically Signed   By: Roxy Horseman M.D.   On: 02/14/2013 12:19    Scheduled Meds: . [facility] HOLD] aspirin EC  81 mg Oral Daily  . [facility] HOLD] atenolol  50 mg Oral Daily   And  . [MAR HOLD] chlorthalidone  25 mg Oral Daily  . [MAR HOLD] enoxaparin (LOVENOX) injection  40 mg Subcutaneous Q24H  . [MAR HOLD] mometasone-formoterol  2 puff Inhalation BID  . [MAR HOLD] pantoprazole  40 mg Oral BID WC  . [facility] HOLD] polyvinyl alcohol  1 drop Both Eyes QID  . sodium chloride  3 mL Intravenous Q12H   Continuous Infusions: . sodium chloride 125 mL/hr at 02/14/13 1429    Principal Problem:   Chest pain Active Problems:   OSA on CPAP   Gout   HTN (hypertension)    Time spent: 25 minutes.     ,  [facility] Pager [phone]. If 7PM-7AM, please contact night-coverage at [email or web address], password [facility] 02/14/2013, 4:39 PM  LOS: 2 days

## 2013-02-14 NOTE — Progress Notes (Signed)
Pt given discharge instructions with understanding with husband at bedside. Pt has no questions at this time. Iv and monitor d/c. Pt wheeled out via wheelchair by staff.

## 2013-02-14 NOTE — H&P (View-Only) (Signed)
       Patient Name: Wendy Ochoa Date of Encounter: 02/14/2013    SUBJECTIVE: Completed the nuclear study and awaiting the results. Denies chest pain. Has no dyspnea. Has a sense of chest fullness when she swallows.  TELEMETRY:  NSR: Filed Vitals:   02/14/13 0941 02/14/13 0944 02/14/13 0946 02/14/13 0948  BP: 152/71 166/53 122/57 129/69  Pulse: 60 72 81 79  Temp:      TempSrc:      Resp:      Height:      Weight:      SpO2:        Intake/Output Summary (Last 24 hours) at 02/14/13 1141 Last data filed at 02/13/13 1200  Gross per 24 hour  Intake    360 ml  Output      0 ml  Net    360 ml    LABS: Basic Metabolic Panel:  Recent Labs  16/10/96 0610 02/14/13 0401  NA 139 138  K 3.3* 3.8  CL 99 99  CO2 31 31  GLUCOSE 109* 140*  BUN 16 16  CREATININE 0.97 1.00  CALCIUM 8.9 8.7  MG 1.8 1.9   CBC:  Recent Labs  02/12/13 1343 02/12/13 1536 02/12/13 2117  WBC 14.5*  --  12.8*  NEUTROABS 5.7  --   --   HGB 14.8 16.3* 14.7  HCT 42.9 48.0* 43.4  MCV 86.7  --  87.5  PLT 349  --  358   Cardiac Enzymes:  Recent Labs  02/12/13 2300 02/13/13 0610 02/13/13 1205  TROPONINI <0.30 <0.30 <0.30   BNP    Component Value Date/Time   PROBNP 65.4 02/12/2013 2300    Hemoglobin A1C:  Recent Labs  02/12/13 2117  HGBA1C 6.5*   Fasting Lipid Panel:  Recent Labs  02/12/13 2117  CHOL 173  HDL 37*  LDLCALC 98  TRIG 045*  CHOLHDL 4.7    Radiology/Studies:  Nuclear perfusion study is pending  Physical Exam: Blood pressure 129/69, pulse 79, temperature 98.3 F (36.8 C), temperature source Oral, resp. rate 16, height 5\' 6"  (1.676 m), weight 238 lb 1.6 oz (108.001 kg), SpO2 94.00%. Weight change:    S4 gallop  ASSESSMENT:  1. Chest pain with no evidence MI and pending nuclear. There may be a GI component to the pain. Back discomfort also raises the possibility of aortic discomfort.  Plan:  No fuerther ischemic w/u if nuclear is low risk. If  discomfort persists, consider GI and aortic origin  Selinda Eon 02/14/2013, 11:41 AM

## 2013-02-14 NOTE — Discharge Summary (Signed)
Physician Discharge Summary  [name] NFA:[id number] DOB: 10/22/1950 DOA: 02/12/2013  PCP: Leanor Rubenstein, MD  Admit date: 02/12/2013 Discharge date: 02/14/2013  Time spent: 35 minutes  Recommendations for Outpatient Follow-up:  1. FU with PCP, if chest pain persist she will need GI evaluation.   Discharge Diagnoses:    Chest pain, negative Cath, question GI in origin.    OSA on CPAP   Gout   HTN (hypertension)   Discharge Condition: Stable  Diet recommendation: Heart HEalthy  Filed Weights   02/12/13 2151  Weight: 108.001 kg (238 lb 1.6 oz)    History of present illness:  Wendy Ochoa 62 yo BF PMHx HTN , OSA (on CPAP setting unk), Gout, benign left breast calcifications FMHx Early cardiac disease and early cardiac death; Brother died age 10 from a massive heart attack, both parents with heart disease. She presents at the request of her physician from the physician's office with complaint of intermittent heaviness on her chest. States chest pain started approximately 2 weeks ago unprovoked, substernal, negative radiation, negative diaphoresis, negative N./V., rated 5/10. The result spontaneously. States last serious episode was Saturday which lasted for approximately 24 hours into Sunday. Her EKG shows sinus bradycardia with no signs of significant ischemia, the patient is on a beta blocker. Due to her intermittent symptoms, heaviness sensation she will need admission to the hospital for cardiac evaluation and rule out. She has never had any cardiac testing in the past. Currently (-) CP.    Hospital Course:  Chest pain; pressure, patient with typical and Atypical symptoms, multiple risk factors age, history of hypertension, obesity. EKG with T wave abnormalities, junctional rhythm. Continue with aspirin, nitroglycerin, protonix. Myoview with perfusion defect. Cath show normal CAD. She will need GI evaluation if symptoms persist. Patient discharge on protonix.  Hypokalemia:  resolved.  Hypertension: continue with atenolol, Chlorthalidone   Procedures: Cath; normal,Widely patent coronary arteries including the LAD which supplies the apex.  2. Normal left ventricular systolic function  3. False positive myocardial perfusion study with the apical perfusion abnormality likely secondary to breast attenuation    Myoview: 1. Largely fixed perfusion defect at the apex and inferior wall may  be related to myocardial thinning. However, a small amount of  reversibility at the apex is difficult to exclude. No other  reversible perfusion defects.  2. Normal wall motion study with calculated ejection fraction of  60%    Consultations:  Dr Katrinka Blazing  Discharge Exam: Filed Vitals:   02/14/13 1617  BP:   Pulse: 58  Temp:   Resp:     General: No distress.  Cardiovascular: S 1, S 2 RRR Respiratory: CTA  Discharge Instructions  Discharge Orders   Future Orders Complete By Expires   Diet - low sodium heart healthy  As directed    Increase activity slowly  As directed        Medication List    STOP taking these medications       naproxen sodium 220 MG tablet  Commonly known as:  ANAPROX      TAKE these medications       allopurinol 100 MG tablet  Commonly known as:  ZYLOPRIM  Take 100 mg by mouth daily as needed (gout).     aspirin 81 MG EC tablet  Take 1 tablet (81 mg total) by mouth daily.     atenolol-chlorthalidone 50-25 MG per tablet  Commonly known as:  TENORETIC  Take 1 tablet by mouth daily.  CLEAR EYES FOR DRY EYES OP  Apply 1 drop to eye 4 (four) times daily as needed (dry eyes).     colchicine 0.6 MG tablet  Take 0.6 mg by mouth daily as needed (gout).     DULERA 100-5 MCG/ACT Aero  Generic drug:  mometasone-formoterol  Inhale 2 puffs into the lungs as needed for wheezing.     pantoprazole 40 MG tablet  Commonly known as:  PROTONIX  Take 1 tablet (40 mg total) by mouth 2 (two) times daily with a meal.     predniSONE 20  MG tablet  Commonly known as:  DELTASONE  Take 20 mg by mouth daily. Dose pack. Scheduled to finish tomorrow 02/13/13       Allergies  Allergen Reactions  . Benicar [Olmesartan]     coughing  . Lisinopril     coughing      The results of significant diagnostics from this hospitalization (including imaging, microbiology, ancillary and laboratory) are listed below for reference.    Significant Diagnostic Studies: Nm Myocar Multi W/spect W/wall Motion / Ef  02/14/2013   CLINICAL DATA:  Chest pain. History of hypertension and gout.  The  EXAM: MYOCARDIAL IMAGING WITH SPECT (REST AND PHARMACOLOGIC-STRESS)  GATED LEFT VENTRICULAR WALL MOTION STUDY  LEFT VENTRICULAR EJECTION FRACTION  TECHNIQUE: Standard myocardial SPECT imaging was performed after resting intravenous injection of 10 mCi Tc-84m sestamibi. Subsequently, intravenous infusion of Lexiscan was performed under the supervision of the Cardiology staff. At peak effect of the drug, 30 mCi Tc-27m sestamibi was injected intravenously and standard myocardial SPECT imaging was performed. Quantitative gated imaging was also performed to evaluate left ventricular wall motion, and estimate left ventricular ejection fraction.  COMPARISON:  Chest CTA 05/03/2010.  FINDINGS: SPECT images demonstrated mildly decreased activity within the apex and inferior wall. This appears largely fixed, although a small amount of reversibility at the apex cannot be excluded. The summed different score is 5.  The gated images were reviewed and demonstrate normal left ventricular wall motion and systolic thickening. The QGS ejection fraction calculated at rest is 60% with an end-diastolic volume of 82 ml and an end-systolic volume of 33ml.  IMPRESSION: 1. Largely fixed perfusion defect at the apex and inferior wall may be related to myocardial thinning. However, a small amount of reversibility at the apex is difficult to exclude. No other reversible perfusion defects. 2.  Normal wall motion study with calculated ejection fraction of 60%.   Electronically Signed   By: Roxy Horseman M.D.   On: 02/14/2013 12:19   Dg Chest Portable 1 View  02/12/2013   CLINICAL DATA:  Chest pressure, left upper back pain, shortness of breath, cough  EXAM: PORTABLE CHEST - 1 VIEW  COMPARISON:  05/03/2010  FINDINGS: The heart size and mediastinal contours are within normal limits. Both lungs are clear. The visualized skeletal structures are unremarkable.  IMPRESSION: No active disease.   Electronically Signed   By: Ruel Favors M.D.   On: 02/12/2013 14:32    Microbiology: No results found for this or any previous visit (from the past 240 hour(s)).   Labs: Basic Metabolic Panel:  Recent Labs Lab 02/12/13 1536 02/12/13 2117 02/12/13 2220 02/13/13 0610 02/14/13 0401  NA 143  --   --  139 138  K 2.9*  --  2.7* 3.3* 3.8  CL 96  --   --  99 99  CO2  --   --   --  31 31  GLUCOSE 81  --   --  109* 140*  BUN 18  --   --  16 16  CREATININE 1.20* 0.93  --  0.97 1.00  CALCIUM  --   --   --  8.9 8.7  MG  --   --  1.8 1.8 1.9   Liver Function Tests:  Recent Labs Lab 02/13/13 0610 02/14/13 0401  AST 13 10  ALT 11 10  ALKPHOS 104 96  BILITOT 0.5 0.5  PROT 7.7 6.9  ALBUMIN 3.1* 2.7*   No results found for this basename: LIPASE, AMYLASE,  in the last 168 hours No results found for this basename: AMMONIA,  in the last 168 hours CBC:  Recent Labs Lab 02/12/13 1343 02/12/13 1536 02/12/13 2117  WBC 14.5*  --  12.8*  NEUTROABS 5.7  --   --   HGB 14.8 16.3* 14.7  HCT 42.9 48.0* 43.4  MCV 86.7  --  87.5  PLT 349  --  358   Cardiac Enzymes:  Recent Labs Lab 02/12/13 2300 02/13/13 0610 02/13/13 1205  TROPONINI <0.30 <0.30 <0.30   BNP: BNP (last 3 results)  Recent Labs  02/12/13 2300  PROBNP 65.4   CBG: No results found for this basename: GLUCAP,  in the last 168 hours     Signed:  ,  [facility] 02/14/2013, 5:10 PM

## 2013-02-14 NOTE — Progress Notes (Signed)
       Patient Name: Wendy Ochoa Date of Encounter: 02/14/2013    SUBJECTIVE: Completed the nuclear study and awaiting the results. Denies chest pain. Has no dyspnea. Has a sense of chest fullness when she swallows.  TELEMETRY:  NSR: Filed Vitals:   02/14/13 0941 02/14/13 0944 02/14/13 0946 02/14/13 0948  BP: 152/71 166/53 122/57 129/69  Pulse: 60 72 81 79  Temp:      TempSrc:      Resp:      Height:      Weight:      SpO2:        Intake/Output Summary (Last 24 hours) at 02/14/13 1141 Last data filed at 02/13/13 1200  Gross per 24 hour  Intake    360 ml  Output      0 ml  Net    360 ml    LABS: Basic Metabolic Panel:  Recent Labs  02/13/13 0610 02/14/13 0401  NA 139 138  K 3.3* 3.8  CL 99 99  CO2 31 31  GLUCOSE 109* 140*  BUN 16 16  CREATININE 0.97 1.00  CALCIUM 8.9 8.7  MG 1.8 1.9   CBC:  Recent Labs  02/12/13 1343 02/12/13 1536 02/12/13 2117  WBC 14.5*  --  12.8*  NEUTROABS 5.7  --   --   HGB 14.8 16.3* 14.7  HCT 42.9 48.0* 43.4  MCV 86.7  --  87.5  PLT 349  --  358   Cardiac Enzymes:  Recent Labs  02/12/13 2300 02/13/13 0610 02/13/13 1205  TROPONINI <0.30 <0.30 <0.30   BNP    Component Value Date/Time   PROBNP 65.4 02/12/2013 2300    Hemoglobin A1C:  Recent Labs  02/12/13 2117  HGBA1C 6.5*   Fasting Lipid Panel:  Recent Labs  02/12/13 2117  CHOL 173  HDL 37*  LDLCALC 98  TRIG 189*  CHOLHDL 4.7    Radiology/Studies:  Nuclear perfusion study is pending  Physical Exam: Blood pressure 129/69, pulse 79, temperature 98.3 F (36.8 C), temperature source Oral, resp. rate 16, height 5' 6" (1.676 m), weight 238 lb 1.6 oz (108.001 kg), SpO2 94.00%. Weight change:    S4 gallop  ASSESSMENT:  1. Chest pain with no evidence MI and pending nuclear. There may be a GI component to the pain. Back discomfort also raises the possibility of aortic discomfort.  Plan:  No fuerther ischemic w/u if nuclear is low risk. If  discomfort persists, consider GI and aortic origin  Signed, SMITH III,HENRY W 02/14/2013, 11:41 AM  

## 2013-04-03 ENCOUNTER — Emergency Department (INDEPENDENT_AMBULATORY_CARE_PROVIDER_SITE_OTHER): Payer: BC Managed Care – PPO

## 2013-04-03 ENCOUNTER — Emergency Department (HOSPITAL_COMMUNITY): Payer: BC Managed Care – PPO

## 2013-04-03 ENCOUNTER — Emergency Department (INDEPENDENT_AMBULATORY_CARE_PROVIDER_SITE_OTHER)
Admission: EM | Admit: 2013-04-03 | Discharge: 2013-04-03 | Disposition: A | Discharge status: Home / Self Care | Payer: BC Managed Care – PPO | Source: Home / Self Care

## 2013-04-03 ENCOUNTER — Encounter (HOSPITAL_COMMUNITY): Payer: Self-pay | Admitting: Emergency Medicine

## 2013-04-03 DIAGNOSIS — R262 Difficulty in walking, not elsewhere classified: Secondary | ICD-10-CM

## 2013-04-03 DIAGNOSIS — M175 Other unilateral secondary osteoarthritis of knee: Secondary | ICD-10-CM

## 2013-04-03 DIAGNOSIS — M222X2 Patellofemoral disorders, left knee: Secondary | ICD-10-CM

## 2013-04-03 DIAGNOSIS — M1732 Unilateral post-traumatic osteoarthritis, left knee: Secondary | ICD-10-CM

## 2013-04-03 DIAGNOSIS — M25569 Pain in unspecified knee: Secondary | ICD-10-CM

## 2013-04-03 MED ORDER — TRAMADOL HCL 50 MG PO TABS: 50.0000 mg | ORAL_TABLET | Freq: Four times a day (QID) | ORAL | Status: DC | PRN

## 2013-04-03 MED ORDER — NAPROXEN 375 MG PO TABS: 375.0000 mg | ORAL_TABLET | Freq: Two times a day (BID) | ORAL | Status: DC

## 2013-04-03 NOTE — ED Notes (Signed)
C/o pain in her left knee; no known injury; states her knee feels hot
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
## 2013-04-04 NOTE — ED Provider Notes (Signed)
Medical screening examination/treatment/procedure(s) were performed by resident physician or non-physician practitioner and as supervising physician I was immediately available for consultation/collaboration.   Barkley Bruns MD.   Linna Hoff, MD 04/04/13 [phone]

## 2013-08-02 ENCOUNTER — Other Ambulatory Visit (HOSPITAL_COMMUNITY): Payer: Self-pay | Admitting: Obstetrics and Gynecology

## 2013-08-02 DIAGNOSIS — E049 Nontoxic goiter, unspecified: Secondary | ICD-10-CM

## 2013-08-06 ENCOUNTER — Ambulatory Visit (HOSPITAL_COMMUNITY)
Admission: RE | Admit: 2013-08-06 | Discharge: 2013-08-06 | Disposition: A | Discharge status: Home / Self Care | Payer: BC Managed Care – PPO | Source: Ambulatory Visit | Attending: Obstetrics and Gynecology | Admitting: Obstetrics and Gynecology

## 2013-08-06 DIAGNOSIS — E042 Nontoxic multinodular goiter: Secondary | ICD-10-CM | POA: Insufficient documentation

## 2013-08-06 DIAGNOSIS — E049 Nontoxic goiter, unspecified: Secondary | ICD-10-CM | POA: Insufficient documentation

## 2013-08-21 ENCOUNTER — Other Ambulatory Visit: Payer: Self-pay | Admitting: Obstetrics and Gynecology

## 2013-08-21 DIAGNOSIS — E041 Nontoxic single thyroid nodule: Secondary | ICD-10-CM

## 2013-08-23 ENCOUNTER — Other Ambulatory Visit: Payer: Self-pay | Admitting: Obstetrics and Gynecology

## 2013-08-23 DIAGNOSIS — E042 Nontoxic multinodular goiter: Secondary | ICD-10-CM

## 2013-08-27 ENCOUNTER — Other Ambulatory Visit (HOSPITAL_COMMUNITY)
Admission: RE | Admit: 2013-08-27 | Discharge: 2013-08-27 | Disposition: A | Discharge status: Home / Self Care | Payer: BC Managed Care – PPO | Source: Ambulatory Visit | Attending: Interventional Radiology | Admitting: Interventional Radiology

## 2013-08-27 ENCOUNTER — Ambulatory Visit
Admission: RE | Admit: 2013-08-27 | Discharge: 2013-08-27 | Disposition: A | Discharge status: Home / Self Care | Payer: BC Managed Care – PPO | Source: Ambulatory Visit | Attending: Obstetrics and Gynecology | Admitting: Obstetrics and Gynecology

## 2013-08-27 DIAGNOSIS — E041 Nontoxic single thyroid nodule: Secondary | ICD-10-CM

## 2013-08-27 DIAGNOSIS — E042 Nontoxic multinodular goiter: Secondary | ICD-10-CM

## 2013-09-23 ENCOUNTER — Ambulatory Visit (INDEPENDENT_AMBULATORY_CARE_PROVIDER_SITE_OTHER): Payer: BC Managed Care – PPO | Admitting: Surgery

## 2013-09-23 ENCOUNTER — Encounter (INDEPENDENT_AMBULATORY_CARE_PROVIDER_SITE_OTHER): Payer: Self-pay | Admitting: Surgery

## 2013-09-23 VITALS — BP 136/78 | HR 62 | Temp 97.9°F | Ht 66.0 in | Wt 243.0 lb

## 2013-09-23 DIAGNOSIS — E042 Nontoxic multinodular goiter: Secondary | ICD-10-CM

## 2013-09-23 NOTE — Progress Notes (Signed)
General Surgery [facility]  Chief Complaint  Patient presents with  . New Evaluation    multiple thyroid nodules - referral from Dr. Arvella Nigh    HISTORY: Patient is a 63 year old female referred by her gynecologist for evaluation of newly diagnosed multinodular thyroid goiter. Patient was found on routine physical exam in April 2015 to have an enlarged thyroid with nodularity. Ultrasound was performed. This demonstrated an enlarged right thyroid lobe with 2 dominant nodules measuring 4.6 cm and 3.0 cm respectively. Left thyroid lobe was somewhat smaller but contained a 1.4 cm and a 1.5 cm dominant nodule. Patient subsequently underwent ultrasound-guided fine needle aspiration biopsy of the 2 dominant nodules on the right. Cytopathology was benign on both lesions.  Thyroid function tests were obtained and show a suppressed TSH of 0.300 but a normal T4 level of 10.5.  Patient has no prior history of thyroid disease. She has never been on thyroid medication. She has had no prior head or neck surgery.  There is a family history of thyroid nodules in the patient's sister. There is a history of thyroid cancer and her niece. There is no family history of other endocrine neoplasms.  Past Medical History  Diagnosis Date  . Gout   . Sinus drainage   . Allergy   . Sleep apnea   . Hypertension     Current Outpatient Prescriptions  Medication Sig Dispense Refill  . acetaminophen (TYLENOL) 325 MG tablet Take 2 tablets (650 mg total) by mouth every 4 (four) hours as needed.  20 tablet  -0  . allopurinol (ZYLOPRIM) 100 MG tablet Take 100 mg by mouth daily as needed (gout).       Marland Kitchen aspirin EC 81 MG EC tablet Take 1 tablet (81 mg total) by mouth daily.  30 tablet  0  . atenolol-chlorthalidone (TENORETIC) 50-25 MG per tablet Take 1 tablet by mouth daily.      . Carboxymethylcellul-Glycerin (CLEAR EYES FOR DRY EYES OP) Apply 1 drop to eye 4 (four) times daily as needed (dry  eyes).      . colchicine 0.6 MG tablet Take 0.6 mg by mouth daily as needed (gout).      . indomethacin (INDOCIN) 50 MG capsule       . mometasone-formoterol (DULERA) 100-5 MCG/ACT AERO Inhale 2 puffs into the lungs as needed for wheezing.      . naproxen (NAPROSYN) 375 MG tablet Take 1 tablet (375 mg total) by mouth 2 (two) times daily.  20 tablet  0  . pantoprazole (PROTONIX) 40 MG tablet Take 1 tablet (40 mg total) by mouth 2 (two) times daily with a meal.  60 tablet  0  . traMADol (ULTRAM) 50 MG tablet Take 1 tablet (50 mg total) by mouth every 6 (six) hours as needed.  15 tablet  0   No current facility-administered medications for this visit.    Allergies  Allergen Reactions  . Benicar [Olmesartan]     coughing  . Lisinopril     coughing    Family History  Problem Relation Age of Onset  . Pancreatic cancer Mother   . Diabetes Mother   . Diabetes Sister   . Heart disease Brother     History   Social History  . Marital Status: Married    Spouse Name: N/A    Number of Children: N/A  . Years of Education: N/A   Social History Main Topics  . Smoking status: Never Smoker   .  Smokeless tobacco: Never Used  . Alcohol Use: No  . Drug Use: No  . Sexual Activity: None   Other Topics Concern  . None   Social History Narrative  . None    REVIEW OF SYSTEMS - PERTINENT POSITIVES ONLY: Denies compressive symptoms. Denies tremor. Denies palpitations. Denies discomfort. Denies globus sensation.  EXAM: Filed Vitals:   09/23/13 1421  BP: 136/78  Pulse: 62  Temp: 97.9 F (36.6 C)    GENERAL: well-developed, well-nourished, no acute distress HEENT: normocephalic; pupils equal and reactive; sclerae clear; dentition good; mucous membranes moist NECK:  Palpable dominant nodule right thyroid lobe extending beneath the right clavicle, mobile with swallowing; left thyroid lobe without palpable abnormality; asymmetric on extension; no palpable anterior or posterior cervical  lymphadenopathy; no supraclavicular masses; no tenderness CHEST: clear to auscultation bilaterally without rales, rhonchi, or wheezes CARDIAC: regular rate and rhythm without significant murmur; peripheral pulses are full EXT:  non-tender without edema; no deformity NEURO: no gross focal deficits; no sign of tremor   LABORATORY RESULTS: See [facility] (CHL-Epic) for most recent results  RADIOLOGY RESULTS: See [facility] (CHL-Epic) for most recent results  IMPRESSION: #1 multinodular thyroid goiter with benign cytopathology #2 borderline hyperthyroidism, asymptomatic  PLAN: I discussed the above findings at length with the patient. We reviewed her ultrasound report and her laboratory studies. I have recommended continued close follow-up and she agrees. I would like to see her back in 6 months with a repeat ultrasound and a TSH level, total T4 level, and total T3 level. We have discussed the possibility of surgery. If there is continued growth in the size of her thyroid nodules or is she has progressive hyperthyroidism, then I would recommend total thyroidectomy.  Patient understands and agrees to follow-up. We will see her back in 6 months.  Earnstine Regal, MD, [facility]  Primary Care Physician: Lynne Logan, MD

## 2013-09-23 NOTE — Patient Instructions (Signed)

## 2014-03-19 ENCOUNTER — Other Ambulatory Visit: Payer: BC Managed Care – PPO

## 2014-03-25 ENCOUNTER — Ambulatory Visit
Admission: RE | Admit: 2014-03-25 | Discharge: 2014-03-25 | Disposition: A | Discharge status: Home / Self Care | Payer: BC Managed Care – PPO | Source: Ambulatory Visit | Attending: Surgery | Admitting: Surgery

## 2014-03-25 ENCOUNTER — Other Ambulatory Visit (INDEPENDENT_AMBULATORY_CARE_PROVIDER_SITE_OTHER): Payer: Self-pay | Admitting: Surgery

## 2014-03-25 DIAGNOSIS — E042 Nontoxic multinodular goiter: Secondary | ICD-10-CM

## 2014-03-26 LAB — T3: T3, Total: 132.5 ng/dL (ref 80.0–204.0)

## 2014-03-26 LAB — T4: T4, Total: 7.7 ug/dL (ref 4.5–12.0)

## 2014-03-27 ENCOUNTER — Encounter (HOSPITAL_COMMUNITY): Payer: Self-pay | Admitting: Interventional Cardiology

## 2014-08-05 ENCOUNTER — Other Ambulatory Visit: Payer: Self-pay | Admitting: Obstetrics and Gynecology

## 2014-08-06 LAB — CYTOLOGY - PAP

## 2014-12-16 ENCOUNTER — Other Ambulatory Visit: Payer: Self-pay | Admitting: Family Medicine

## 2014-12-16 DIAGNOSIS — R1011 Right upper quadrant pain: Secondary | ICD-10-CM

## 2014-12-23 ENCOUNTER — Ambulatory Visit
Admission: RE | Admit: 2014-12-23 | Discharge: 2014-12-23 | Disposition: A | Discharge status: Home / Self Care | Payer: BLUE CROSS/BLUE SHIELD | Source: Ambulatory Visit | Attending: Family Medicine | Admitting: Family Medicine

## 2014-12-23 DIAGNOSIS — R1011 Right upper quadrant pain: Secondary | ICD-10-CM

## 2015-06-04 ENCOUNTER — Other Ambulatory Visit: Payer: Self-pay | Admitting: Surgery

## 2015-06-04 DIAGNOSIS — E042 Nontoxic multinodular goiter: Secondary | ICD-10-CM

## 2015-06-09 ENCOUNTER — Ambulatory Visit
Admission: RE | Admit: 2015-06-09 | Discharge: 2015-06-09 | Disposition: A | Discharge status: Home / Self Care | Payer: Managed Care, Other (non HMO) | Source: Ambulatory Visit | Attending: Surgery | Admitting: Surgery

## 2015-06-09 DIAGNOSIS — E042 Nontoxic multinodular goiter: Secondary | ICD-10-CM

## 2016-11-23 DIAGNOSIS — M71571 Other bursitis, not elsewhere classified, right ankle and foot: Secondary | ICD-10-CM | POA: Diagnosis not present

## 2016-11-23 DIAGNOSIS — S93491A Sprain of other ligament of right ankle, initial encounter: Secondary | ICD-10-CM | POA: Diagnosis not present

## 2016-11-23 DIAGNOSIS — M25571 Pain in right ankle and joints of right foot: Secondary | ICD-10-CM | POA: Diagnosis not present

## 2016-11-23 DIAGNOSIS — M7661 Achilles tendinitis, right leg: Secondary | ICD-10-CM | POA: Diagnosis not present

## 2016-12-05 DIAGNOSIS — M722 Plantar fascial fibromatosis: Secondary | ICD-10-CM | POA: Diagnosis not present

## 2016-12-05 DIAGNOSIS — M65871 Other synovitis and tenosynovitis, right ankle and foot: Secondary | ICD-10-CM | POA: Diagnosis not present

## 2017-01-26 DIAGNOSIS — M109 Gout, unspecified: Secondary | ICD-10-CM | POA: Diagnosis not present

## 2017-02-21 DIAGNOSIS — E119 Type 2 diabetes mellitus without complications: Secondary | ICD-10-CM | POA: Diagnosis not present

## 2017-02-21 DIAGNOSIS — Z23 Encounter for immunization: Secondary | ICD-10-CM | POA: Diagnosis not present

## 2017-02-21 DIAGNOSIS — I10 Essential (primary) hypertension: Secondary | ICD-10-CM | POA: Diagnosis not present

## 2017-02-21 DIAGNOSIS — Z6837 Body mass index (BMI) 37.0-37.9, adult: Secondary | ICD-10-CM | POA: Diagnosis not present

## 2017-03-29 DIAGNOSIS — K5900 Constipation, unspecified: Secondary | ICD-10-CM | POA: Diagnosis not present

## 2017-03-29 DIAGNOSIS — R140 Abdominal distension (gaseous): Secondary | ICD-10-CM | POA: Diagnosis not present

## 2017-03-29 DIAGNOSIS — R109 Unspecified abdominal pain: Secondary | ICD-10-CM | POA: Diagnosis not present

## 2017-06-26 DIAGNOSIS — E042 Nontoxic multinodular goiter: Secondary | ICD-10-CM | POA: Diagnosis not present

## 2017-06-27 ENCOUNTER — Other Ambulatory Visit: Payer: Self-pay | Admitting: Surgery

## 2017-06-27 DIAGNOSIS — E042 Nontoxic multinodular goiter: Secondary | ICD-10-CM

## 2017-07-03 ENCOUNTER — Ambulatory Visit
Admission: RE | Admit: 2017-07-03 | Discharge: 2017-07-03 | Disposition: A | Discharge status: Home / Self Care | Payer: Medicare Other | Source: Ambulatory Visit | Attending: Surgery | Admitting: Surgery

## 2017-07-03 DIAGNOSIS — E042 Nontoxic multinodular goiter: Secondary | ICD-10-CM | POA: Diagnosis not present

## 2017-07-04 DIAGNOSIS — Z1389 Encounter for screening for other disorder: Secondary | ICD-10-CM | POA: Diagnosis not present

## 2017-07-04 DIAGNOSIS — Z0000 Encounter for general adult medical examination without abnormal findings: Secondary | ICD-10-CM | POA: Diagnosis not present

## 2017-08-07 DIAGNOSIS — L500 Allergic urticaria: Secondary | ICD-10-CM | POA: Diagnosis not present

## 2017-08-29 DIAGNOSIS — L509 Urticaria, unspecified: Secondary | ICD-10-CM | POA: Diagnosis not present

## 2017-09-13 DIAGNOSIS — M545 Low back pain: Secondary | ICD-10-CM | POA: Diagnosis not present

## 2017-09-18 DIAGNOSIS — R062 Wheezing: Secondary | ICD-10-CM | POA: Diagnosis not present

## 2017-09-18 DIAGNOSIS — M109 Gout, unspecified: Secondary | ICD-10-CM | POA: Diagnosis not present

## 2017-09-18 DIAGNOSIS — I10 Essential (primary) hypertension: Secondary | ICD-10-CM | POA: Diagnosis not present

## 2017-09-18 DIAGNOSIS — E1169 Type 2 diabetes mellitus with other specified complication: Secondary | ICD-10-CM | POA: Diagnosis not present

## 2017-09-25 DIAGNOSIS — H25013 Cortical age-related cataract, bilateral: Secondary | ICD-10-CM | POA: Diagnosis not present

## 2017-09-25 DIAGNOSIS — H524 Presbyopia: Secondary | ICD-10-CM | POA: Diagnosis not present

## 2017-09-25 DIAGNOSIS — H43821 Vitreomacular adhesion, right eye: Secondary | ICD-10-CM | POA: Diagnosis not present

## 2017-09-25 DIAGNOSIS — H5203 Hypermetropia, bilateral: Secondary | ICD-10-CM | POA: Diagnosis not present

## 2017-09-25 DIAGNOSIS — E119 Type 2 diabetes mellitus without complications: Secondary | ICD-10-CM | POA: Diagnosis not present

## 2017-10-09 DIAGNOSIS — Z1231 Encounter for screening mammogram for malignant neoplasm of breast: Secondary | ICD-10-CM | POA: Diagnosis not present

## 2017-10-09 DIAGNOSIS — Z6837 Body mass index (BMI) 37.0-37.9, adult: Secondary | ICD-10-CM | POA: Diagnosis not present

## 2017-10-09 DIAGNOSIS — Z124 Encounter for screening for malignant neoplasm of cervix: Secondary | ICD-10-CM | POA: Diagnosis not present

## 2017-10-09 DIAGNOSIS — Z01419 Encounter for gynecological examination (general) (routine) without abnormal findings: Secondary | ICD-10-CM | POA: Diagnosis not present

## 2017-10-09 DIAGNOSIS — N760 Acute vaginitis: Secondary | ICD-10-CM | POA: Diagnosis not present

## 2017-10-31 DIAGNOSIS — M109 Gout, unspecified: Secondary | ICD-10-CM | POA: Diagnosis not present

## 2017-11-06 DIAGNOSIS — E042 Nontoxic multinodular goiter: Secondary | ICD-10-CM | POA: Diagnosis not present

## 2017-12-23 DIAGNOSIS — J069 Acute upper respiratory infection, unspecified: Secondary | ICD-10-CM | POA: Diagnosis not present

## 2017-12-26 DIAGNOSIS — K148 Other diseases of tongue: Secondary | ICD-10-CM | POA: Diagnosis not present

## 2017-12-26 DIAGNOSIS — T7840XA Allergy, unspecified, initial encounter: Secondary | ICD-10-CM | POA: Diagnosis not present

## 2018-01-03 DIAGNOSIS — M25572 Pain in left ankle and joints of left foot: Secondary | ICD-10-CM | POA: Diagnosis not present

## 2018-01-03 DIAGNOSIS — M7752 Other enthesopathy of left foot: Secondary | ICD-10-CM | POA: Diagnosis not present

## 2018-01-03 DIAGNOSIS — M10079 Idiopathic gout, unspecified ankle and foot: Secondary | ICD-10-CM | POA: Diagnosis not present

## 2018-01-17 DIAGNOSIS — M79674 Pain in right toe(s): Secondary | ICD-10-CM | POA: Diagnosis not present

## 2018-01-17 DIAGNOSIS — M79675 Pain in left toe(s): Secondary | ICD-10-CM | POA: Diagnosis not present

## 2018-01-17 DIAGNOSIS — B351 Tinea unguium: Secondary | ICD-10-CM | POA: Diagnosis not present

## 2018-01-17 DIAGNOSIS — L600 Ingrowing nail: Secondary | ICD-10-CM | POA: Diagnosis not present

## 2018-01-30 DIAGNOSIS — L600 Ingrowing nail: Secondary | ICD-10-CM | POA: Diagnosis not present

## 2018-02-19 DIAGNOSIS — L600 Ingrowing nail: Secondary | ICD-10-CM | POA: Diagnosis not present

## 2018-02-19 DIAGNOSIS — M79674 Pain in right toe(s): Secondary | ICD-10-CM | POA: Diagnosis not present

## 2018-02-19 DIAGNOSIS — B351 Tinea unguium: Secondary | ICD-10-CM | POA: Diagnosis not present

## 2018-02-19 DIAGNOSIS — M79675 Pain in left toe(s): Secondary | ICD-10-CM | POA: Diagnosis not present

## 2018-02-28 DIAGNOSIS — R350 Frequency of micturition: Secondary | ICD-10-CM | POA: Diagnosis not present

## 2018-03-19 DIAGNOSIS — M79675 Pain in left toe(s): Secondary | ICD-10-CM | POA: Diagnosis not present

## 2018-03-19 DIAGNOSIS — L600 Ingrowing nail: Secondary | ICD-10-CM | POA: Diagnosis not present

## 2018-03-19 DIAGNOSIS — M79674 Pain in right toe(s): Secondary | ICD-10-CM | POA: Diagnosis not present

## 2018-03-19 DIAGNOSIS — B351 Tinea unguium: Secondary | ICD-10-CM | POA: Diagnosis not present

## 2018-03-20 DIAGNOSIS — I10 Essential (primary) hypertension: Secondary | ICD-10-CM | POA: Diagnosis not present

## 2018-03-20 DIAGNOSIS — Z1389 Encounter for screening for other disorder: Secondary | ICD-10-CM | POA: Diagnosis not present

## 2018-03-20 DIAGNOSIS — Z23 Encounter for immunization: Secondary | ICD-10-CM | POA: Diagnosis not present

## 2018-03-20 DIAGNOSIS — E7800 Pure hypercholesterolemia, unspecified: Secondary | ICD-10-CM | POA: Diagnosis not present

## 2018-03-20 DIAGNOSIS — E1169 Type 2 diabetes mellitus with other specified complication: Secondary | ICD-10-CM | POA: Diagnosis not present

## 2018-03-27 DIAGNOSIS — I10 Essential (primary) hypertension: Secondary | ICD-10-CM | POA: Diagnosis not present

## 2018-03-27 DIAGNOSIS — E1169 Type 2 diabetes mellitus with other specified complication: Secondary | ICD-10-CM | POA: Diagnosis not present

## 2018-04-03 DIAGNOSIS — H25013 Cortical age-related cataract, bilateral: Secondary | ICD-10-CM | POA: Diagnosis not present

## 2018-04-03 DIAGNOSIS — H524 Presbyopia: Secondary | ICD-10-CM | POA: Diagnosis not present

## 2018-04-03 DIAGNOSIS — H5203 Hypermetropia, bilateral: Secondary | ICD-10-CM | POA: Diagnosis not present

## 2018-04-03 DIAGNOSIS — H04123 Dry eye syndrome of bilateral lacrimal glands: Secondary | ICD-10-CM | POA: Diagnosis not present

## 2018-04-03 DIAGNOSIS — E119 Type 2 diabetes mellitus without complications: Secondary | ICD-10-CM | POA: Diagnosis not present

## 2018-04-03 DIAGNOSIS — H43821 Vitreomacular adhesion, right eye: Secondary | ICD-10-CM | POA: Diagnosis not present

## 2018-04-23 DIAGNOSIS — M71571 Other bursitis, not elsewhere classified, right ankle and foot: Secondary | ICD-10-CM | POA: Diagnosis not present

## 2018-04-23 DIAGNOSIS — M79674 Pain in right toe(s): Secondary | ICD-10-CM | POA: Diagnosis not present

## 2018-04-23 DIAGNOSIS — L600 Ingrowing nail: Secondary | ICD-10-CM | POA: Diagnosis not present

## 2018-04-23 DIAGNOSIS — B351 Tinea unguium: Secondary | ICD-10-CM | POA: Diagnosis not present

## 2018-04-23 DIAGNOSIS — M7661 Achilles tendinitis, right leg: Secondary | ICD-10-CM | POA: Diagnosis not present

## 2018-04-23 DIAGNOSIS — M79675 Pain in left toe(s): Secondary | ICD-10-CM | POA: Diagnosis not present

## 2018-05-09 DIAGNOSIS — M7661 Achilles tendinitis, right leg: Secondary | ICD-10-CM | POA: Diagnosis not present

## 2018-05-09 DIAGNOSIS — M722 Plantar fascial fibromatosis: Secondary | ICD-10-CM | POA: Diagnosis not present

## 2018-05-23 DIAGNOSIS — I10 Essential (primary) hypertension: Secondary | ICD-10-CM | POA: Diagnosis not present

## 2018-05-23 DIAGNOSIS — R809 Proteinuria, unspecified: Secondary | ICD-10-CM | POA: Diagnosis not present

## 2018-05-23 DIAGNOSIS — Z1389 Encounter for screening for other disorder: Secondary | ICD-10-CM | POA: Diagnosis not present

## 2018-05-23 DIAGNOSIS — R140 Abdominal distension (gaseous): Secondary | ICD-10-CM | POA: Diagnosis not present

## 2018-05-23 DIAGNOSIS — M109 Gout, unspecified: Secondary | ICD-10-CM | POA: Diagnosis not present

## 2018-06-01 ENCOUNTER — Other Ambulatory Visit: Payer: Self-pay | Admitting: Surgery

## 2018-06-01 DIAGNOSIS — E042 Nontoxic multinodular goiter: Secondary | ICD-10-CM

## 2018-06-15 ENCOUNTER — Ambulatory Visit
Admission: RE | Admit: 2018-06-15 | Discharge: 2018-06-15 | Disposition: A | Discharge status: Home / Self Care | Payer: Medicare Other | Source: Ambulatory Visit | Attending: Surgery | Admitting: Surgery

## 2018-06-15 DIAGNOSIS — E041 Nontoxic single thyroid nodule: Secondary | ICD-10-CM | POA: Diagnosis not present

## 2018-06-15 DIAGNOSIS — E042 Nontoxic multinodular goiter: Secondary | ICD-10-CM

## 2018-06-29 DIAGNOSIS — E042 Nontoxic multinodular goiter: Secondary | ICD-10-CM | POA: Diagnosis not present

## 2018-07-09 DIAGNOSIS — E042 Nontoxic multinodular goiter: Secondary | ICD-10-CM | POA: Diagnosis not present

## 2018-08-11 DIAGNOSIS — M109 Gout, unspecified: Secondary | ICD-10-CM | POA: Diagnosis not present

## 2018-08-22 DIAGNOSIS — M2011 Hallux valgus (acquired), right foot: Secondary | ICD-10-CM | POA: Diagnosis not present

## 2018-08-22 DIAGNOSIS — M7751 Other enthesopathy of right foot: Secondary | ICD-10-CM | POA: Diagnosis not present

## 2018-08-22 DIAGNOSIS — M25571 Pain in right ankle and joints of right foot: Secondary | ICD-10-CM | POA: Diagnosis not present

## 2018-09-13 DIAGNOSIS — M25572 Pain in left ankle and joints of left foot: Secondary | ICD-10-CM | POA: Diagnosis not present

## 2018-09-13 DIAGNOSIS — M10072 Idiopathic gout, left ankle and foot: Secondary | ICD-10-CM | POA: Diagnosis not present

## 2018-09-24 DIAGNOSIS — I10 Essential (primary) hypertension: Secondary | ICD-10-CM | POA: Diagnosis not present

## 2018-09-24 DIAGNOSIS — E1169 Type 2 diabetes mellitus with other specified complication: Secondary | ICD-10-CM | POA: Diagnosis not present

## 2018-09-24 DIAGNOSIS — G4733 Obstructive sleep apnea (adult) (pediatric): Secondary | ICD-10-CM | POA: Diagnosis not present

## 2018-09-24 DIAGNOSIS — E7800 Pure hypercholesterolemia, unspecified: Secondary | ICD-10-CM | POA: Diagnosis not present

## 2018-09-24 DIAGNOSIS — Z1389 Encounter for screening for other disorder: Secondary | ICD-10-CM | POA: Diagnosis not present

## 2018-09-24 DIAGNOSIS — Z7984 Long term (current) use of oral hypoglycemic drugs: Secondary | ICD-10-CM | POA: Diagnosis not present

## 2018-09-24 DIAGNOSIS — M109 Gout, unspecified: Secondary | ICD-10-CM | POA: Diagnosis not present

## 2018-09-24 DIAGNOSIS — Z0000 Encounter for general adult medical examination without abnormal findings: Secondary | ICD-10-CM | POA: Diagnosis not present

## 2018-09-25 DIAGNOSIS — M109 Gout, unspecified: Secondary | ICD-10-CM | POA: Diagnosis not present

## 2018-09-25 DIAGNOSIS — E1169 Type 2 diabetes mellitus with other specified complication: Secondary | ICD-10-CM | POA: Diagnosis not present

## 2018-09-25 DIAGNOSIS — E7800 Pure hypercholesterolemia, unspecified: Secondary | ICD-10-CM | POA: Diagnosis not present

## 2018-09-25 DIAGNOSIS — Z7984 Long term (current) use of oral hypoglycemic drugs: Secondary | ICD-10-CM | POA: Diagnosis not present

## 2018-09-25 DIAGNOSIS — I10 Essential (primary) hypertension: Secondary | ICD-10-CM | POA: Diagnosis not present

## 2018-10-01 DIAGNOSIS — H25013 Cortical age-related cataract, bilateral: Secondary | ICD-10-CM | POA: Diagnosis not present

## 2018-10-11 DIAGNOSIS — M25572 Pain in left ankle and joints of left foot: Secondary | ICD-10-CM | POA: Diagnosis not present

## 2018-10-16 DIAGNOSIS — Z1231 Encounter for screening mammogram for malignant neoplasm of breast: Secondary | ICD-10-CM | POA: Diagnosis not present

## 2018-10-16 DIAGNOSIS — Z6835 Body mass index (BMI) 35.0-35.9, adult: Secondary | ICD-10-CM | POA: Diagnosis not present

## 2018-10-16 DIAGNOSIS — J209 Acute bronchitis, unspecified: Secondary | ICD-10-CM | POA: Diagnosis not present

## 2018-10-16 DIAGNOSIS — J40 Bronchitis, not specified as acute or chronic: Secondary | ICD-10-CM | POA: Diagnosis not present

## 2018-10-16 DIAGNOSIS — Z01419 Encounter for gynecological examination (general) (routine) without abnormal findings: Secondary | ICD-10-CM | POA: Diagnosis not present

## 2018-10-31 DIAGNOSIS — H25811 Combined forms of age-related cataract, right eye: Secondary | ICD-10-CM | POA: Diagnosis not present

## 2018-10-31 DIAGNOSIS — Z01818 Encounter for other preprocedural examination: Secondary | ICD-10-CM | POA: Diagnosis not present

## 2018-11-12 DIAGNOSIS — H2511 Age-related nuclear cataract, right eye: Secondary | ICD-10-CM | POA: Diagnosis not present

## 2018-11-12 DIAGNOSIS — H2512 Age-related nuclear cataract, left eye: Secondary | ICD-10-CM | POA: Diagnosis not present

## 2018-11-12 DIAGNOSIS — H25811 Combined forms of age-related cataract, right eye: Secondary | ICD-10-CM | POA: Diagnosis not present

## 2018-11-23 DIAGNOSIS — M25562 Pain in left knee: Secondary | ICD-10-CM | POA: Diagnosis not present

## 2018-11-23 DIAGNOSIS — M7989 Other specified soft tissue disorders: Secondary | ICD-10-CM | POA: Diagnosis not present

## 2018-11-26 DIAGNOSIS — H25812 Combined forms of age-related cataract, left eye: Secondary | ICD-10-CM | POA: Diagnosis not present

## 2018-11-26 DIAGNOSIS — H2512 Age-related nuclear cataract, left eye: Secondary | ICD-10-CM | POA: Diagnosis not present

## 2018-12-12 DIAGNOSIS — I10 Essential (primary) hypertension: Secondary | ICD-10-CM | POA: Diagnosis not present

## 2018-12-12 DIAGNOSIS — K219 Gastro-esophageal reflux disease without esophagitis: Secondary | ICD-10-CM | POA: Diagnosis not present

## 2018-12-18 DIAGNOSIS — R82998 Other abnormal findings in urine: Secondary | ICD-10-CM | POA: Diagnosis not present

## 2018-12-18 DIAGNOSIS — B9620 Unspecified Escherichia coli [E. coli] as the cause of diseases classified elsewhere: Secondary | ICD-10-CM | POA: Diagnosis not present

## 2018-12-18 DIAGNOSIS — R0781 Pleurodynia: Secondary | ICD-10-CM | POA: Diagnosis not present

## 2018-12-18 DIAGNOSIS — M7918 Myalgia, other site: Secondary | ICD-10-CM | POA: Diagnosis not present

## 2018-12-18 DIAGNOSIS — M546 Pain in thoracic spine: Secondary | ICD-10-CM | POA: Diagnosis not present

## 2018-12-18 DIAGNOSIS — N390 Urinary tract infection, site not specified: Secondary | ICD-10-CM | POA: Diagnosis not present

## 2019-01-02 DIAGNOSIS — E1169 Type 2 diabetes mellitus with other specified complication: Secondary | ICD-10-CM | POA: Diagnosis not present

## 2019-01-02 DIAGNOSIS — Z7984 Long term (current) use of oral hypoglycemic drugs: Secondary | ICD-10-CM | POA: Diagnosis not present

## 2019-01-02 DIAGNOSIS — E7800 Pure hypercholesterolemia, unspecified: Secondary | ICD-10-CM | POA: Diagnosis not present

## 2019-01-02 DIAGNOSIS — I10 Essential (primary) hypertension: Secondary | ICD-10-CM | POA: Diagnosis not present

## 2019-01-07 DIAGNOSIS — M25562 Pain in left knee: Secondary | ICD-10-CM | POA: Diagnosis not present

## 2019-01-07 DIAGNOSIS — M1A09X0 Idiopathic chronic gout, multiple sites, without tophus (tophi): Secondary | ICD-10-CM | POA: Diagnosis not present

## 2019-01-30 DIAGNOSIS — Z23 Encounter for immunization: Secondary | ICD-10-CM | POA: Diagnosis not present

## 2019-02-12 DIAGNOSIS — E1169 Type 2 diabetes mellitus with other specified complication: Secondary | ICD-10-CM | POA: Diagnosis not present

## 2019-02-12 DIAGNOSIS — I10 Essential (primary) hypertension: Secondary | ICD-10-CM | POA: Diagnosis not present

## 2019-02-12 DIAGNOSIS — E7800 Pure hypercholesterolemia, unspecified: Secondary | ICD-10-CM | POA: Diagnosis not present

## 2019-02-12 DIAGNOSIS — Z7984 Long term (current) use of oral hypoglycemic drugs: Secondary | ICD-10-CM | POA: Diagnosis not present

## 2019-03-18 DIAGNOSIS — E1169 Type 2 diabetes mellitus with other specified complication: Secondary | ICD-10-CM | POA: Diagnosis not present

## 2019-03-18 DIAGNOSIS — I10 Essential (primary) hypertension: Secondary | ICD-10-CM | POA: Diagnosis not present

## 2019-03-18 DIAGNOSIS — E7800 Pure hypercholesterolemia, unspecified: Secondary | ICD-10-CM | POA: Diagnosis not present

## 2019-04-03 DIAGNOSIS — E7800 Pure hypercholesterolemia, unspecified: Secondary | ICD-10-CM | POA: Diagnosis not present

## 2019-04-03 DIAGNOSIS — E1169 Type 2 diabetes mellitus with other specified complication: Secondary | ICD-10-CM | POA: Diagnosis not present

## 2019-04-03 DIAGNOSIS — I10 Essential (primary) hypertension: Secondary | ICD-10-CM | POA: Diagnosis not present

## 2019-04-23 DIAGNOSIS — Z20828 Contact with and (suspected) exposure to other viral communicable diseases: Secondary | ICD-10-CM | POA: Diagnosis not present

## 2019-04-24 DIAGNOSIS — U071 COVID-19: Secondary | ICD-10-CM | POA: Diagnosis not present

## 2019-04-24 DIAGNOSIS — Z23 Encounter for immunization: Secondary | ICD-10-CM | POA: Diagnosis not present

## 2019-05-08 DIAGNOSIS — I10 Essential (primary) hypertension: Secondary | ICD-10-CM | POA: Diagnosis not present

## 2019-05-08 DIAGNOSIS — E1169 Type 2 diabetes mellitus with other specified complication: Secondary | ICD-10-CM | POA: Diagnosis not present

## 2019-05-08 DIAGNOSIS — Z7984 Long term (current) use of oral hypoglycemic drugs: Secondary | ICD-10-CM | POA: Diagnosis not present

## 2019-05-08 DIAGNOSIS — E7800 Pure hypercholesterolemia, unspecified: Secondary | ICD-10-CM | POA: Diagnosis not present

## 2019-05-19 ENCOUNTER — Emergency Department (HOSPITAL_BASED_OUTPATIENT_CLINIC_OR_DEPARTMENT_OTHER)
Admission: EM | Admit: 2019-05-19 | Discharge: 2019-05-19 | Disposition: A | Discharge status: Home / Self Care | Payer: Medicare Other | Attending: Emergency Medicine | Admitting: Emergency Medicine

## 2019-05-19 ENCOUNTER — Other Ambulatory Visit: Payer: Self-pay

## 2019-05-19 ENCOUNTER — Encounter (HOSPITAL_BASED_OUTPATIENT_CLINIC_OR_DEPARTMENT_OTHER): Payer: Self-pay | Admitting: Emergency Medicine

## 2019-05-19 ENCOUNTER — Emergency Department (HOSPITAL_BASED_OUTPATIENT_CLINIC_OR_DEPARTMENT_OTHER): Payer: Medicare Other

## 2019-05-19 DIAGNOSIS — Z888 Allergy status to other drugs, medicaments and biological substances status: Secondary | ICD-10-CM | POA: Diagnosis not present

## 2019-05-19 DIAGNOSIS — Z79899 Other long term (current) drug therapy: Secondary | ICD-10-CM | POA: Diagnosis not present

## 2019-05-19 DIAGNOSIS — M25562 Pain in left knee: Secondary | ICD-10-CM | POA: Diagnosis not present

## 2019-05-19 DIAGNOSIS — I10 Essential (primary) hypertension: Secondary | ICD-10-CM | POA: Diagnosis not present

## 2019-05-19 DIAGNOSIS — Z7982 Long term (current) use of aspirin: Secondary | ICD-10-CM | POA: Insufficient documentation

## 2019-05-19 DIAGNOSIS — M7989 Other specified soft tissue disorders: Secondary | ICD-10-CM | POA: Diagnosis not present

## 2019-05-19 LAB — BASIC METABOLIC PANEL
Anion gap: 10 (ref 5–15)
BUN: 21 mg/dL (ref 8–23)
CO2: 25 mmol/L (ref 22–32)
Calcium: 9 mg/dL (ref 8.9–10.3)
Chloride: 101 mmol/L (ref 98–111)
Creatinine, Ser: 1.05 mg/dL — ABNORMAL HIGH (ref 0.44–1.00)
GFR calc Af Amer: >60 mL/min (ref >60)
GFR calc non Af Amer: 55 mL/min — ABNORMAL LOW (ref >60)
Glucose, Bld: 122 mg/dL — ABNORMAL HIGH (ref 70–99)
Potassium: 3.2 mmol/L — ABNORMAL LOW (ref 3.5–5.1)
Sodium: 136 mmol/L (ref 135–145)

## 2019-05-19 LAB — CBC WITH DIFFERENTIAL/PLATELET
Abs Immature Granulocytes: 0.03 10*3/uL (ref 0.00–0.07)
Basophils Absolute: 0 10*3/uL (ref 0.0–0.1)
Basophils Relative: 1 %
Eosinophils Absolute: 0.1 10*3/uL (ref 0.0–0.5)
Eosinophils Relative: 2 %
HCT: 41.5 % (ref 36.0–46.0)
Hemoglobin: 13.3 g/dL (ref 12.0–15.0)
Immature Granulocytes: 0 %
Lymphocytes Relative: 33 %
Lymphs Abs: 2.8 10*3/uL (ref 0.7–4.0)
MCH: 27.9 pg (ref 26.0–34.0)
MCHC: 32 g/dL (ref 30.0–36.0)
MCV: 87.2 fL (ref 80.0–100.0)
Monocytes Absolute: 0.7 10*3/uL (ref 0.1–1.0)
Monocytes Relative: 8 %
Neutro Abs: 4.9 10*3/uL (ref 1.7–7.7)
Neutrophils Relative %: 56 %
Platelets: 402 10*3/uL — ABNORMAL HIGH (ref 150–400)
RBC: 4.76 MIL/uL (ref 3.87–5.11)
RDW: 13.4 % (ref 11.5–15.5)
WBC: 8.6 10*3/uL (ref 4.0–10.5)
nRBC: 0 % (ref 0.0–0.2)

## 2019-05-19 MED ORDER — HYDROCODONE-ACETAMINOPHEN 5-325 MG PO TABS: 1.0000 | ORAL_TABLET | Freq: Four times a day (QID) | ORAL | 0 refills | Status: DC | PRN

## 2019-05-19 MED ORDER — LIDOCAINE-EPINEPHRINE 1 %-1:100000 IJ SOLN
10.0000 mL | Freq: Once | INTRAMUSCULAR | Status: AC
  Administered 2019-05-19: 10 mL
  Filled 2019-05-19: qty 1

## 2019-05-19 MED ORDER — MORPHINE SULFATE (PF) 4 MG/ML IV SOLN
4.0000 mg | Freq: Once | INTRAVENOUS | Status: AC
  Administered 2019-05-19: 4 mg via INTRAVENOUS
  Filled 2019-05-19: qty 1

## 2019-05-19 MED ORDER — PREDNISONE 20 MG PO TABS: 40.0000 mg | ORAL_TABLET | Freq: Every day | ORAL | 0 refills | Status: DC

## 2019-05-19 MED ORDER — HYDROCODONE-ACETAMINOPHEN 5-325 MG PO TABS
2.0000 | ORAL_TABLET | Freq: Once | ORAL | Status: AC
  Administered 2019-05-19: 2 via ORAL
  Filled 2019-05-19: qty 2

## 2019-05-19 MED ORDER — INDOMETHACIN 25 MG PO CAPS: 25.0000 mg | ORAL_CAPSULE | Freq: Three times a day (TID) | ORAL | 0 refills | Status: DC | PRN

## 2019-05-19 NOTE — ED Notes (Signed)
Dr Miller at bedside. 

## 2019-05-19 NOTE — ED Notes (Signed)
ED Provider at bedside. 
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
## 2019-05-19 NOTE — Discharge Instructions (Signed)
I have sent your fluid sample to the lab, it will not result for several hours.  I will call you with results if it is abnormal and if you need to come back to the hospital. Your other testing today was unremarkable and reassuring Return to the emergency department immediately for severe pain fever or spreading redness or swelling You may take prednisone daily for the next 5 days You may take indomethacin every 8 hours as needed for pain You may take hydrocodone 1 tablet every 6 hours as needed for severe pain but be aware that this may make you sedated and may make you have constipation so use a stool softener if you take this medicine Please call your doctor in the morning to let them know about your testing and close follow-up.

## 2019-05-19 NOTE — ED Triage Notes (Signed)
Left knee pain for several months, pain increased x 1 week, started Prednisone last Monday until yesterday. Helped with pain, until today.

## 2019-05-20 LAB — SYNOVIAL CELL COUNT + DIFF, W/ CRYSTALS
Crystals, Fluid: NONE SEEN
WBC, Synovial: 7 /mm3 (ref 0–200)

## 2019-05-23 LAB — BODY FLUID CULTURE
Culture: NO GROWTH
Gram Stain: NONE SEEN

## 2019-05-24 DIAGNOSIS — M25562 Pain in left knee: Secondary | ICD-10-CM | POA: Diagnosis not present

## 2019-05-24 DIAGNOSIS — M109 Gout, unspecified: Secondary | ICD-10-CM | POA: Diagnosis not present

## 2019-05-31 DIAGNOSIS — E7800 Pure hypercholesterolemia, unspecified: Secondary | ICD-10-CM | POA: Diagnosis not present

## 2019-05-31 DIAGNOSIS — E1169 Type 2 diabetes mellitus with other specified complication: Secondary | ICD-10-CM | POA: Diagnosis not present

## 2019-05-31 DIAGNOSIS — I10 Essential (primary) hypertension: Secondary | ICD-10-CM | POA: Diagnosis not present

## 2019-07-04 DIAGNOSIS — E1169 Type 2 diabetes mellitus with other specified complication: Secondary | ICD-10-CM | POA: Diagnosis not present

## 2019-07-04 DIAGNOSIS — I10 Essential (primary) hypertension: Secondary | ICD-10-CM | POA: Diagnosis not present

## 2019-07-04 DIAGNOSIS — E7800 Pure hypercholesterolemia, unspecified: Secondary | ICD-10-CM | POA: Diagnosis not present

## 2019-07-19 DIAGNOSIS — Z79899 Other long term (current) drug therapy: Secondary | ICD-10-CM | POA: Diagnosis not present

## 2019-07-19 DIAGNOSIS — M1A09X0 Idiopathic chronic gout, multiple sites, without tophus (tophi): Secondary | ICD-10-CM | POA: Diagnosis not present

## 2019-07-30 DIAGNOSIS — K219 Gastro-esophageal reflux disease without esophagitis: Secondary | ICD-10-CM | POA: Diagnosis not present

## 2019-07-30 DIAGNOSIS — R079 Chest pain, unspecified: Secondary | ICD-10-CM | POA: Diagnosis not present

## 2019-08-05 DIAGNOSIS — E1169 Type 2 diabetes mellitus with other specified complication: Secondary | ICD-10-CM | POA: Diagnosis not present

## 2019-08-05 DIAGNOSIS — I10 Essential (primary) hypertension: Secondary | ICD-10-CM | POA: Diagnosis not present

## 2019-08-05 DIAGNOSIS — E7800 Pure hypercholesterolemia, unspecified: Secondary | ICD-10-CM | POA: Diagnosis not present

## 2019-08-06 DIAGNOSIS — Z961 Presence of intraocular lens: Secondary | ICD-10-CM | POA: Diagnosis not present

## 2019-08-06 DIAGNOSIS — H02889 Meibomian gland dysfunction of unspecified eye, unspecified eyelid: Secondary | ICD-10-CM | POA: Diagnosis not present

## 2019-08-14 ENCOUNTER — Other Ambulatory Visit: Payer: Self-pay | Admitting: Family Medicine

## 2019-08-14 DIAGNOSIS — N6320 Unspecified lump in the left breast, unspecified quadrant: Secondary | ICD-10-CM | POA: Diagnosis not present

## 2019-08-15 ENCOUNTER — Other Ambulatory Visit: Payer: Self-pay | Admitting: Family Medicine

## 2019-08-15 DIAGNOSIS — N6320 Unspecified lump in the left breast, unspecified quadrant: Secondary | ICD-10-CM

## 2019-09-03 ENCOUNTER — Other Ambulatory Visit: Payer: Self-pay

## 2019-09-03 ENCOUNTER — Ambulatory Visit
Admission: RE | Admit: 2019-09-03 | Discharge: 2019-09-03 | Disposition: A | Discharge status: Home / Self Care | Payer: Medicare Other | Source: Ambulatory Visit | Attending: Family Medicine | Admitting: Family Medicine

## 2019-09-03 DIAGNOSIS — N6320 Unspecified lump in the left breast, unspecified quadrant: Secondary | ICD-10-CM | POA: Diagnosis not present

## 2019-09-03 DIAGNOSIS — R928 Other abnormal and inconclusive findings on diagnostic imaging of breast: Secondary | ICD-10-CM | POA: Diagnosis not present

## 2019-09-24 DIAGNOSIS — Z0000 Encounter for general adult medical examination without abnormal findings: Secondary | ICD-10-CM | POA: Diagnosis not present

## 2019-09-24 DIAGNOSIS — E042 Nontoxic multinodular goiter: Secondary | ICD-10-CM | POA: Diagnosis not present

## 2019-09-24 DIAGNOSIS — G4733 Obstructive sleep apnea (adult) (pediatric): Secondary | ICD-10-CM | POA: Diagnosis not present

## 2019-09-24 DIAGNOSIS — Z6835 Body mass index (BMI) 35.0-35.9, adult: Secondary | ICD-10-CM | POA: Diagnosis not present

## 2019-09-24 DIAGNOSIS — Z23 Encounter for immunization: Secondary | ICD-10-CM | POA: Diagnosis not present

## 2019-09-24 DIAGNOSIS — M109 Gout, unspecified: Secondary | ICD-10-CM | POA: Diagnosis not present

## 2019-09-24 DIAGNOSIS — Z1159 Encounter for screening for other viral diseases: Secondary | ICD-10-CM | POA: Diagnosis not present

## 2019-09-24 DIAGNOSIS — Z1389 Encounter for screening for other disorder: Secondary | ICD-10-CM | POA: Diagnosis not present

## 2019-09-24 DIAGNOSIS — E7800 Pure hypercholesterolemia, unspecified: Secondary | ICD-10-CM | POA: Diagnosis not present

## 2019-09-24 DIAGNOSIS — E1169 Type 2 diabetes mellitus with other specified complication: Secondary | ICD-10-CM | POA: Diagnosis not present

## 2019-09-24 DIAGNOSIS — I10 Essential (primary) hypertension: Secondary | ICD-10-CM | POA: Diagnosis not present

## 2019-09-27 DIAGNOSIS — I10 Essential (primary) hypertension: Secondary | ICD-10-CM | POA: Diagnosis not present

## 2019-09-27 DIAGNOSIS — E7800 Pure hypercholesterolemia, unspecified: Secondary | ICD-10-CM | POA: Diagnosis not present

## 2019-09-27 DIAGNOSIS — E1169 Type 2 diabetes mellitus with other specified complication: Secondary | ICD-10-CM | POA: Diagnosis not present

## 2019-11-15 DIAGNOSIS — E1169 Type 2 diabetes mellitus with other specified complication: Secondary | ICD-10-CM | POA: Diagnosis not present

## 2019-11-15 DIAGNOSIS — I10 Essential (primary) hypertension: Secondary | ICD-10-CM | POA: Diagnosis not present

## 2019-11-15 DIAGNOSIS — E7800 Pure hypercholesterolemia, unspecified: Secondary | ICD-10-CM | POA: Diagnosis not present

## 2019-12-04 DIAGNOSIS — M545 Low back pain: Secondary | ICD-10-CM | POA: Diagnosis not present

## 2019-12-04 DIAGNOSIS — N390 Urinary tract infection, site not specified: Secondary | ICD-10-CM | POA: Diagnosis not present

## 2019-12-30 DIAGNOSIS — E1169 Type 2 diabetes mellitus with other specified complication: Secondary | ICD-10-CM | POA: Diagnosis not present

## 2019-12-30 DIAGNOSIS — E7800 Pure hypercholesterolemia, unspecified: Secondary | ICD-10-CM | POA: Diagnosis not present

## 2019-12-30 DIAGNOSIS — I10 Essential (primary) hypertension: Secondary | ICD-10-CM | POA: Diagnosis not present

## 2020-01-21 DIAGNOSIS — Z01419 Encounter for gynecological examination (general) (routine) without abnormal findings: Secondary | ICD-10-CM | POA: Diagnosis not present

## 2020-01-21 DIAGNOSIS — Z6836 Body mass index (BMI) 36.0-36.9, adult: Secondary | ICD-10-CM | POA: Diagnosis not present

## 2020-01-24 DIAGNOSIS — I10 Essential (primary) hypertension: Secondary | ICD-10-CM | POA: Diagnosis not present

## 2020-01-24 DIAGNOSIS — E1169 Type 2 diabetes mellitus with other specified complication: Secondary | ICD-10-CM | POA: Diagnosis not present

## 2020-01-24 DIAGNOSIS — E7800 Pure hypercholesterolemia, unspecified: Secondary | ICD-10-CM | POA: Diagnosis not present

## 2020-02-05 DIAGNOSIS — H02889 Meibomian gland dysfunction of unspecified eye, unspecified eyelid: Secondary | ICD-10-CM | POA: Diagnosis not present

## 2020-02-05 DIAGNOSIS — H04123 Dry eye syndrome of bilateral lacrimal glands: Secondary | ICD-10-CM | POA: Diagnosis not present

## 2020-02-12 DIAGNOSIS — R350 Frequency of micturition: Secondary | ICD-10-CM | POA: Diagnosis not present

## 2020-02-12 DIAGNOSIS — Z23 Encounter for immunization: Secondary | ICD-10-CM | POA: Diagnosis not present

## 2020-02-12 DIAGNOSIS — R140 Abdominal distension (gaseous): Secondary | ICD-10-CM | POA: Diagnosis not present

## 2020-02-18 DIAGNOSIS — Z23 Encounter for immunization: Secondary | ICD-10-CM | POA: Diagnosis not present

## 2020-03-17 DIAGNOSIS — K219 Gastro-esophageal reflux disease without esophagitis: Secondary | ICD-10-CM | POA: Diagnosis not present

## 2020-03-17 DIAGNOSIS — I10 Essential (primary) hypertension: Secondary | ICD-10-CM | POA: Diagnosis not present

## 2020-03-17 DIAGNOSIS — E7800 Pure hypercholesterolemia, unspecified: Secondary | ICD-10-CM | POA: Diagnosis not present

## 2020-03-17 DIAGNOSIS — E1169 Type 2 diabetes mellitus with other specified complication: Secondary | ICD-10-CM | POA: Diagnosis not present

## 2020-03-25 DIAGNOSIS — E7800 Pure hypercholesterolemia, unspecified: Secondary | ICD-10-CM | POA: Diagnosis not present

## 2020-03-25 DIAGNOSIS — E1169 Type 2 diabetes mellitus with other specified complication: Secondary | ICD-10-CM | POA: Diagnosis not present

## 2020-03-25 DIAGNOSIS — E042 Nontoxic multinodular goiter: Secondary | ICD-10-CM | POA: Diagnosis not present

## 2020-03-25 DIAGNOSIS — Z7984 Long term (current) use of oral hypoglycemic drugs: Secondary | ICD-10-CM | POA: Diagnosis not present

## 2020-03-25 DIAGNOSIS — I10 Essential (primary) hypertension: Secondary | ICD-10-CM | POA: Diagnosis not present

## 2020-04-07 DIAGNOSIS — K219 Gastro-esophageal reflux disease without esophagitis: Secondary | ICD-10-CM | POA: Diagnosis not present

## 2020-04-07 DIAGNOSIS — E1169 Type 2 diabetes mellitus with other specified complication: Secondary | ICD-10-CM | POA: Diagnosis not present

## 2020-04-07 DIAGNOSIS — E7800 Pure hypercholesterolemia, unspecified: Secondary | ICD-10-CM | POA: Diagnosis not present

## 2020-04-07 DIAGNOSIS — I10 Essential (primary) hypertension: Secondary | ICD-10-CM | POA: Diagnosis not present

## 2020-04-09 DIAGNOSIS — Z20822 Contact with and (suspected) exposure to covid-19: Secondary | ICD-10-CM | POA: Diagnosis not present

## 2020-04-09 DIAGNOSIS — R6889 Other general symptoms and signs: Secondary | ICD-10-CM | POA: Diagnosis not present

## 2020-04-09 DIAGNOSIS — R6883 Chills (without fever): Secondary | ICD-10-CM | POA: Diagnosis not present

## 2020-04-09 DIAGNOSIS — R52 Pain, unspecified: Secondary | ICD-10-CM | POA: Diagnosis not present

## 2020-04-24 DIAGNOSIS — R3915 Urgency of urination: Secondary | ICD-10-CM | POA: Diagnosis not present

## 2020-04-29 DIAGNOSIS — K219 Gastro-esophageal reflux disease without esophagitis: Secondary | ICD-10-CM | POA: Diagnosis not present

## 2020-04-29 DIAGNOSIS — R109 Unspecified abdominal pain: Secondary | ICD-10-CM | POA: Diagnosis not present

## 2020-05-12 DIAGNOSIS — R102 Pelvic and perineal pain: Secondary | ICD-10-CM | POA: Diagnosis not present

## 2020-05-13 DIAGNOSIS — K219 Gastro-esophageal reflux disease without esophagitis: Secondary | ICD-10-CM | POA: Diagnosis not present

## 2020-05-13 DIAGNOSIS — E1169 Type 2 diabetes mellitus with other specified complication: Secondary | ICD-10-CM | POA: Diagnosis not present

## 2020-05-13 DIAGNOSIS — I10 Essential (primary) hypertension: Secondary | ICD-10-CM | POA: Diagnosis not present

## 2020-05-13 DIAGNOSIS — E7800 Pure hypercholesterolemia, unspecified: Secondary | ICD-10-CM | POA: Diagnosis not present

## 2020-05-15 ENCOUNTER — Encounter: Payer: Self-pay | Admitting: Gastroenterology

## 2020-05-22 DIAGNOSIS — I10 Essential (primary) hypertension: Secondary | ICD-10-CM | POA: Diagnosis not present

## 2020-05-22 DIAGNOSIS — K219 Gastro-esophageal reflux disease without esophagitis: Secondary | ICD-10-CM | POA: Diagnosis not present

## 2020-05-22 DIAGNOSIS — E7800 Pure hypercholesterolemia, unspecified: Secondary | ICD-10-CM | POA: Diagnosis not present

## 2020-05-22 DIAGNOSIS — E1169 Type 2 diabetes mellitus with other specified complication: Secondary | ICD-10-CM | POA: Diagnosis not present

## 2020-05-25 DIAGNOSIS — R102 Pelvic and perineal pain: Secondary | ICD-10-CM | POA: Diagnosis not present

## 2020-05-29 ENCOUNTER — Encounter: Payer: Self-pay | Admitting: Gastroenterology

## 2020-05-29 ENCOUNTER — Ambulatory Visit (INDEPENDENT_AMBULATORY_CARE_PROVIDER_SITE_OTHER): Payer: Medicare Other | Admitting: Gastroenterology

## 2020-05-29 VITALS — BP 160/76 | HR 73 | Ht 66.0 in | Wt 225.0 lb

## 2020-05-29 DIAGNOSIS — R194 Change in bowel habit: Secondary | ICD-10-CM

## 2020-05-29 DIAGNOSIS — R1084 Generalized abdominal pain: Secondary | ICD-10-CM | POA: Diagnosis not present

## 2020-05-29 DIAGNOSIS — R140 Abdominal distension (gaseous): Secondary | ICD-10-CM | POA: Insufficient documentation

## 2020-05-29 DIAGNOSIS — R197 Diarrhea, unspecified: Secondary | ICD-10-CM | POA: Diagnosis not present

## 2020-05-29 MED ORDER — PLENVU 140 G PO SOLR: 1.0000 | Freq: Once | ORAL | 0 refills | Status: AC

## 2020-05-29 NOTE — Patient Instructions (Signed)
If you are age 69 or older, your body mass index should be between 23-30. Your Body mass index is 36.32 kg/m. If this is out of the aforementioned range listed, please consider follow up with your Primary Care Provider.  If you are age 20 or younger, your body mass index should be between 19-25. Your Body mass index is 36.32 kg/m. If this is out of the aformentioned range listed, please consider follow up with your Primary Care Provider.  Start a probiotic daily such as [employer] developer.    You have been scheduled for a colonoscopy. Please follow written instructions given to you at your visit today.  Please pick up your prep supplies at the pharmacy within the next 1-3 days. If you use inhalers (even only as needed), please bring them with you on the day of your procedure.  Due to recent changes in healthcare laws, you may see the results of your imaging and laboratory studies on MyChart before your provider has had a chance to review them.  We understand that in some cases there may be results that are confusing or concerning to you. Not all laboratory results come back in the same time frame and the provider may be waiting for multiple results in order to interpret others.  Please give [place] 48 hours in order for your provider to thoroughly review all the results before contacting the office for clarification of your results.
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
## 2020-05-29 NOTE — Progress Notes (Signed)
Agree with the assessment and plan as outlined by Alonza Bogus, PA-C. If evaluation as outlined unrevealing, given Fhx of FDR with Pancreatic CA, will have low threshold for cross sectional imaging.    , DO, [facility]

## 2020-06-02 DIAGNOSIS — M8500 Fibrous dysplasia (monostotic), unspecified site: Secondary | ICD-10-CM | POA: Diagnosis not present

## 2020-06-18 ENCOUNTER — Other Ambulatory Visit: Payer: Self-pay

## 2020-06-18 ENCOUNTER — Ambulatory Visit (AMBULATORY_SURGERY_CENTER): Payer: Medicare Other | Admitting: Gastroenterology

## 2020-06-18 ENCOUNTER — Encounter: Payer: Self-pay | Admitting: Gastroenterology

## 2020-06-18 VITALS — BP 137/72 | HR 59 | Temp 96.9°F | Resp 15 | Ht 66.0 in | Wt 225.0 lb

## 2020-06-18 DIAGNOSIS — R140 Abdominal distension (gaseous): Secondary | ICD-10-CM | POA: Diagnosis not present

## 2020-06-18 DIAGNOSIS — D125 Benign neoplasm of sigmoid colon: Secondary | ICD-10-CM | POA: Diagnosis not present

## 2020-06-18 DIAGNOSIS — R197 Diarrhea, unspecified: Secondary | ICD-10-CM

## 2020-06-18 DIAGNOSIS — R194 Change in bowel habit: Secondary | ICD-10-CM

## 2020-06-18 DIAGNOSIS — D122 Benign neoplasm of ascending colon: Secondary | ICD-10-CM

## 2020-06-18 DIAGNOSIS — K5730 Diverticulosis of large intestine without perforation or abscess without bleeding: Secondary | ICD-10-CM

## 2020-06-18 DIAGNOSIS — Z1211 Encounter for screening for malignant neoplasm of colon: Secondary | ICD-10-CM | POA: Diagnosis not present

## 2020-06-18 MED ORDER — SODIUM CHLORIDE 0.9 % IV SOLN: 500.0000 mL | Freq: Once | INTRAVENOUS | Status: DC

## 2020-06-18 NOTE — Op Note (Signed)
[facility] Patient Name: Wendy Ochoa Procedure Date: 06/18/2020 7:52 AM MRN: [id number] Endoscopist: Gerrit Heck , MD Age: 70 Referring MD:  Date of Birth: 10/16/1950 Gender: Female Account #: [id number] Procedure:                Colonoscopy Indications:              Diarrhea, Change in stool, Bloating                           Last colonoscopy in 09/2012 was normal. Medicines:                Monitored Anesthesia Care Procedure:                Pre-Anesthesia Assessment:                           - Prior to the procedure, a History and Physical                            was performed, and patient medications and                            allergies were reviewed. The patient's tolerance of                            previous anesthesia was also reviewed. The risks                            and benefits of the procedure and the sedation                            options and risks were discussed with the patient.                            All questions were answered, and informed consent                            was obtained. Prior Anticoagulants: The patient has                            taken no previous anticoagulant or antiplatelet                            agents. ASA Grade Assessment: II - A patient with                            mild systemic disease. After reviewing the risks                            and benefits, the patient was deemed in                            satisfactory condition to undergo the procedure.  After obtaining informed consent, the colonoscope                            was passed under direct vision. Throughout the                            procedure, the patient's blood pressure, pulse, and                            oxygen saturations were monitored continuously. The                            Olympus [id number]-[id number] (#[id number]) Colonoscope was                            introduced through the anus and advanced  to the the                            terminal ileum. The colonoscopy was performed                            without difficulty. The patient tolerated the                            procedure well. The quality of the bowel                            preparation was good. The terminal ileum, ileocecal                            valve, appendiceal orifice, and rectum were                            photographed. Scope In: 8:03:53 AM Scope Out: 8:26:53 AM Scope Withdrawal Time: 0 hours 17 minutes 49 seconds  Total Procedure Duration: 0 hours 23 minutes 0 seconds  Findings:                 The perianal and digital rectal examinations were                            normal.                           Three sessile polyps were found in the sigmoid                            colon (2) and ascending colon. The polyps were 3 to                            4 mm in size. These polyps were removed with a cold                            snare. Resection and retrieval were complete.  Estimated blood loss was minimal.                           A few small-mouthed diverticula were found in the                            sigmoid colon.                           The mucosa was otherwise normal throughout the                            entire colon. Biopsies for histology were taken                            with a cold forceps from the right colon and left                            colon for evaluation of microscopic colitis.                            Estimated blood loss was minimal.                           The retroflexed view of the distal rectum and anal                            verge was normal and showed no anal or rectal                            abnormalities.                           The terminal ileum appeared normal. Complications:            No immediate complications. Estimated Blood Loss:     Estimated blood loss was minimal. Impression:               -  Three 3 to 4 mm polyps in the sigmoid colon and                            in the ascending colon, removed with a cold snare.                            Resected and retrieved.                           - Diverticulosis in the sigmoid colon.                           - Normal mucosa in the entire examined colon.                            Biopsied.                           -  The distal rectum and anal verge are normal on                            retroflexion view.                           - The examined portion of the ileum was normal. Recommendation:           - Patient has a contact number available for                            emergencies. The signs and symptoms of potential                            delayed complications were discussed with the                            patient. Return to normal activities tomorrow.                            Written discharge instructions were provided to the                            patient.                           - Resume previous diet.                           - Continue present medications.                           - Await pathology results.                           - Repeat colonoscopy for surveillance based on                            pathology results.                           - Return to GI office at appointment to be                            scheduled.                           - If symptoms persist and biopsies from today's                            procedure otherwise unrevealing, recommend stool                            studies to include GI PCR panel, fecal elastase,                            along with cross  sectional imaging (CT abd/pelvis). Gerrit Heck, MD 06/18/2020 8:36:09 AM

## 2020-06-18 NOTE — Progress Notes (Signed)
A/ox3, pleased with MAC, report to RN 

## 2020-06-18 NOTE — Progress Notes (Signed)
Pt's states no medical or surgical changes since previsit or office visit. 

## 2020-06-18 NOTE — Progress Notes (Signed)
Called to room to assist during endoscopic procedure.  Patient ID and intended procedure confirmed with present staff. Received instructions for my participation in the procedure from the performing physician.  

## 2020-06-18 NOTE — Patient Instructions (Signed)
Thank you for letting us take care of your healthcare needs today. Please see handouts given to you on Polyps and Diverticulosis.   YOU HAD AN ENDOSCOPIC PROCEDURE TODAY AT THE [facility]:   Refer to the procedure report that was given to you for any specific questions about what was found during the examination.  If the procedure report does not answer your questions, please call your gastroenterologist to clarify.  If you requested that your care partner not be given the details of your procedure findings, then the procedure report has been included in a sealed envelope for you to review at your convenience later.  YOU SHOULD EXPECT: Some feelings of bloating in the abdomen. Passage of more gas than usual.  Walking can help get rid of the air that was put into your GI tract during the procedure and reduce the bloating. If you had a lower endoscopy (such as a colonoscopy or flexible sigmoidoscopy) you may notice spotting of blood in your stool or on the toilet paper. If you underwent a bowel prep for your procedure, you may not have a normal bowel movement for a few days.  Please Note:  You might notice some irritation and congestion in your nose or some drainage.  This is from the oxygen used during your procedure.  There is no need for concern and it should clear up in a day or so.  SYMPTOMS TO REPORT IMMEDIATELY:  Following lower endoscopy (colonoscopy or flexible sigmoidoscopy):  Excessive amounts of blood in the stool  Significant tenderness or worsening of abdominal pains  Swelling of the abdomen that is new, acute  Fever of 100F or higher   For urgent or emergent issues, a gastroenterologist can be reached at any hour by calling [phone]. Do not use MyChart messaging for urgent concerns.    DIET:  We do recommend a small meal at first, but then you may proceed to your regular diet.  Drink plenty of fluids but you should avoid alcoholic beverages for 24  hours.  ACTIVITY:  You should plan to take it easy for the rest of today and you should NOT DRIVE or use heavy machinery until tomorrow (because of the sedation medicines used during the test).    FOLLOW UP: Our staff will call the number listed on your records 48-72 hours following your procedure to check on you and address any questions or concerns that you may have regarding the information given to you following your procedure. If we do not reach you, we will leave a message.  We will attempt to reach you two times.  During this call, we will ask if you have developed any symptoms of COVID 19. If you develop any symptoms (ie: fever, flu-like symptoms, shortness of breath, cough etc.) before then, please call [phone].  If you test positive for Covid 19 in the 2 weeks post procedure, please call and report this information to us.    If any biopsies were taken you will be contacted by phone or by letter within the next 1-3 weeks.  Please call us at [phone] if you have not heard about the biopsies in 3 weeks.    SIGNATURES/CONFIDENTIALITY: You and/or your care partner have signed paperwork which will be entered into your electronic medical record.  These signatures attest to the fact that that the information above on your After Visit Summary has been reviewed and is understood.  Full responsibility of the confidentiality of this discharge information lies   with you and/or your care-partner.  

## 2020-06-22 ENCOUNTER — Telehealth: Payer: Self-pay | Admitting: *Deleted

## 2020-06-22 NOTE — Telephone Encounter (Signed)
  Follow up Call-  Call back number 06/18/2020  Post procedure Call Back phone  # [phone]  Permission to leave phone message Yes  Some recent data might be hidden     Patient questions:  Do you have a fever, pain , or abdominal swelling? No. Pain Score  0 *  Have you tolerated food without any problems? Yes.    Have you been able to return to your normal activities? Yes.    Do you have any questions about your discharge instructions: Diet   No. Medications  No. Follow up visit  No.  Do you have questions or concerns about your Care? No.  Actions: * If pain score is 4 or above: No action needed, pain <4.

## 2020-07-01 ENCOUNTER — Encounter: Payer: Self-pay | Admitting: Gastroenterology

## 2020-08-05 DIAGNOSIS — H02889 Meibomian gland dysfunction of unspecified eye, unspecified eyelid: Secondary | ICD-10-CM | POA: Diagnosis not present

## 2020-08-05 DIAGNOSIS — H04123 Dry eye syndrome of bilateral lacrimal glands: Secondary | ICD-10-CM | POA: Diagnosis not present

## 2020-08-07 DIAGNOSIS — Z79899 Other long term (current) drug therapy: Secondary | ICD-10-CM | POA: Diagnosis not present

## 2020-08-07 DIAGNOSIS — M1A09X0 Idiopathic chronic gout, multiple sites, without tophus (tophi): Secondary | ICD-10-CM | POA: Diagnosis not present

## 2020-08-11 DIAGNOSIS — R350 Frequency of micturition: Secondary | ICD-10-CM | POA: Diagnosis not present

## 2020-08-11 DIAGNOSIS — M545 Low back pain, unspecified: Secondary | ICD-10-CM | POA: Diagnosis not present

## 2020-08-11 DIAGNOSIS — M47816 Spondylosis without myelopathy or radiculopathy, lumbar region: Secondary | ICD-10-CM | POA: Diagnosis not present

## 2020-08-26 DIAGNOSIS — E1169 Type 2 diabetes mellitus with other specified complication: Secondary | ICD-10-CM | POA: Diagnosis not present

## 2020-08-26 DIAGNOSIS — I10 Essential (primary) hypertension: Secondary | ICD-10-CM | POA: Diagnosis not present

## 2020-08-26 DIAGNOSIS — E7800 Pure hypercholesterolemia, unspecified: Secondary | ICD-10-CM | POA: Diagnosis not present

## 2020-08-26 DIAGNOSIS — K219 Gastro-esophageal reflux disease without esophagitis: Secondary | ICD-10-CM | POA: Diagnosis not present

## 2020-10-02 DIAGNOSIS — E1169 Type 2 diabetes mellitus with other specified complication: Secondary | ICD-10-CM | POA: Diagnosis not present

## 2020-10-02 DIAGNOSIS — M109 Gout, unspecified: Secondary | ICD-10-CM | POA: Diagnosis not present

## 2020-10-02 DIAGNOSIS — E042 Nontoxic multinodular goiter: Secondary | ICD-10-CM | POA: Diagnosis not present

## 2020-10-02 DIAGNOSIS — G4733 Obstructive sleep apnea (adult) (pediatric): Secondary | ICD-10-CM | POA: Diagnosis not present

## 2020-10-02 DIAGNOSIS — K219 Gastro-esophageal reflux disease without esophagitis: Secondary | ICD-10-CM | POA: Diagnosis not present

## 2020-10-02 DIAGNOSIS — E7800 Pure hypercholesterolemia, unspecified: Secondary | ICD-10-CM | POA: Diagnosis not present

## 2020-10-02 DIAGNOSIS — Z0000 Encounter for general adult medical examination without abnormal findings: Secondary | ICD-10-CM | POA: Diagnosis not present

## 2020-10-02 DIAGNOSIS — I10 Essential (primary) hypertension: Secondary | ICD-10-CM | POA: Diagnosis not present

## 2020-10-02 DIAGNOSIS — Z1389 Encounter for screening for other disorder: Secondary | ICD-10-CM | POA: Diagnosis not present

## 2020-10-14 ENCOUNTER — Other Ambulatory Visit: Payer: Self-pay

## 2020-10-14 ENCOUNTER — Ambulatory Visit (INDEPENDENT_AMBULATORY_CARE_PROVIDER_SITE_OTHER): Payer: Medicare Other | Admitting: Gastroenterology

## 2020-10-14 ENCOUNTER — Encounter: Payer: Self-pay | Admitting: Gastroenterology

## 2020-10-14 ENCOUNTER — Other Ambulatory Visit: Payer: Medicare Other

## 2020-10-14 VITALS — BP 142/78 | HR 64 | Ht 66.0 in | Wt 225.0 lb

## 2020-10-14 DIAGNOSIS — R1010 Upper abdominal pain, unspecified: Secondary | ICD-10-CM

## 2020-10-14 DIAGNOSIS — R194 Change in bowel habit: Secondary | ICD-10-CM

## 2020-10-14 DIAGNOSIS — R197 Diarrhea, unspecified: Secondary | ICD-10-CM

## 2020-10-14 DIAGNOSIS — R140 Abdominal distension (gaseous): Secondary | ICD-10-CM | POA: Diagnosis not present

## 2020-10-14 DIAGNOSIS — Z800 Family history of malignant neoplasm of digestive organs: Secondary | ICD-10-CM | POA: Diagnosis not present

## 2020-10-14 MED ORDER — DICYCLOMINE HCL 10 MG PO CAPS: 10.0000 mg | ORAL_CAPSULE | Freq: Four times a day (QID) | ORAL | 0 refills | Status: AC | PRN

## 2020-10-14 NOTE — Progress Notes (Signed)
Chief Complaint:    Abdominal bloating, loose stools  GI History: 70 year old female with a history of HTN, diabetes, gout, obesity, OSA.  Developed change in bowel habits/diarrhea after COVID in 04/2019.  No hematochezia.  Does have associated generalized abdominal bloating and discomfort, but not frank pain.  Started trial of probiotic in 05/2020 without improvement. - Colonoscopy (06/18/2020): 3 polyps (TA x1, SSP x1, HP x1), sigmoid diverticulosis.  Otherwise normal colon mucosa with biopsies negative for MC, IBD.  Normal TI.  Repeat in 5 years. - 07/2020: Normal CBC, BMP (CO2 33)  Family history notable for mother with Pancreatic Cancer.  Endoscopic History: - Colonoscopy (09/2002): Normal - Colonoscopy (09/2012): Normal - Colonoscopy (06/18/2020): 3 polyps (TA x1, SSP x1, HP x1), sigmoid diverticulosis.  Otherwise normal colon mucosa with biopsies negative for MC, IBD.  Normal TI.  Repeat in 5 years.  No prior EGD.   HPI:     Patient is a 70 y.o. female presenting to the [facility]astroenterology Clinic for follow-up.  Last seen by Alonza Bogus on 05/29/2020 for evaluation of change in bowel habits (diarrhea) since having COVID in 04/2019.  Completed colonoscopy in 06/2020 with 3 polyps, but otherwise normal mucosa.  Recommended stool studies and cross-sectional imaging, but neither has been completed.  Today, she states she continues to have intermittent abdominal bloating, typically 2-3 days/week, and can be w/o sxs for a week at a time. Also with intermittent post prandial urgency with loose stools.   Has reduced her Prednisone for gout to 2.5 mg.    Review of systems:     No chest pain, no SOB, no fevers, no urinary sx   Past Medical History:  Diagnosis Date   Allergy    Diabetes ([place])    Gout    Hypertension    Obesity    Sinus drainage    Sleep apnea     Patient's surgical history, family medical history, social history, medications and allergies were all reviewed in Epic     Current Outpatient Medications  Medication Sig Dispense Refill   pravastatin (PRAVACHOL) 40 MG tablet Take 40 mg by mouth daily.     Probiotic Product (PROBIOTIC-10 PO) Take 1 capsule by mouth daily.     allopurinol (ZYLOPRIM) 100 MG tablet Take 100 mg by mouth daily as needed (gout).      amLODipine (NORVASC) 10 MG tablet Take 10 mg by mouth every morning.     atenolol-chlorthalidone (TENORETIC) 50-25 MG per tablet Take 1 tablet by mouth daily.     chlorthalidone (HYGROTON) 25 MG tablet Take 25 mg by mouth every morning.     esomeprazole (NEXIUM) 20 MG capsule 1 capsule     metFORMIN (GLUCOPHAGE-XR) 500 MG 24 hr tablet metformin ER 500 mg tablet,extended release 24 hr  TAKE 1 TABLET BY MOUTH TWICE A DAY FOR 90 DAYS     predniSONE (STERAPRED UNI-PAK 21 TAB) 5 MG (21) TBPK tablet See admin instructions.     No current facility-administered medications for this visit.    Physical Exam:     BP (!) 142/78   Pulse 64   Ht 5\' 6"  (1.676 m)   Wt 225 lb (102.1 kg)   SpO2 98%   BMI 36.32 kg/m   GENERAL:  Pleasant female in NAD PSYCH: : Cooperative, normal affect EENT:  conjunctiva pink, mucous membranes moist, neck supple without masses CARDIAC:  RRR, no murmur heard, no peripheral edema PULM: Normal respiratory effort, lungs CTA bilaterally, no wheezing ABDOMEN:  Nondistended, soft, nontender. No obvious masses, no hepatomegaly,  normal bowel sounds SKIN:  turgor, no lesions seen Musculoskeletal:  Normal muscle tone, normal strength NEURO: Alert and oriented x 3, no focal neurologic deficits   IMPRESSION and PLAN:    1) Upper abdominal pain 2) Abdominal bloating 3) Change in bowel habits 4) Loose stools/fecal urgency 5) Family history of Pancreatic Cancer  - CT abdomen/pelvis - GI PCR panel, pancreatic elastase -Trial of Bentyl 10 mg prn - Low FODMAP diet.  Provided with handout and detailed instructions today - RTC in 3-6 months or sooner as needed - If above w/u  unrevealing and ongoing sxs, will plan for EGD to r/o mucosal/luminal pathology  6) History of colon polyps - Colonoscopy with tubular adenoma x1 and SSP x1 - Repeat colonoscopy in 2027 for ongoing surveillance     I spent 32 minutes of time, including in depth chart review, independent review of results as outlined above, communicating results with the patient directly, face-to-face time with the patient, coordinating care, and ordering studies and medications as appropriate, and documentation.      Wendy Ochoa ,DO, FACG 10/14/2020, 2:29 PM

## 2020-10-14 NOTE — Patient Instructions (Signed)
If you are age 69 or older, your body mass index should be between 23-30. Your Body mass index is 36.32 kg/m. If this is out of the aforementioned range listed, please consider follow up with your Primary Care Provider.  If you are age 73 or younger, your body mass index should be between 19-25. Your Body mass index is 36.32 kg/m. If this is out of the aformentioned range listed, please consider follow up with your Primary Care Provider.   You have been scheduled for a CT scan of the abdomen and pelvis at [facility] ([address]---this is in the same building as Press [facility]).  Go to radiology and schedule your Ultrasound  Please go to the 2nd floor of this building today and schedule your labwork, [place], [address].   Low FODMAP Diet: (Fermentable Oligosaccharides, Disaccharides, Monosaccharides, and Polyols) These are short chain carbohydrates and sugar alcohols that are poorly absorbed by the body, resulting in multiple abdominal symptoms, including changes in bowel habits, abdominal pain/discomfort, bloating, abdominal distension, gas, etc.      __________________________________

## 2020-10-15 ENCOUNTER — Other Ambulatory Visit: Payer: Medicare Other

## 2020-10-15 DIAGNOSIS — R197 Diarrhea, unspecified: Secondary | ICD-10-CM

## 2020-10-15 DIAGNOSIS — R140 Abdominal distension (gaseous): Secondary | ICD-10-CM

## 2020-10-15 DIAGNOSIS — R194 Change in bowel habit: Secondary | ICD-10-CM

## 2020-10-15 DIAGNOSIS — Z800 Family history of malignant neoplasm of digestive organs: Secondary | ICD-10-CM | POA: Diagnosis not present

## 2020-10-18 LAB — GI PROFILE, STOOL, PCR

## 2020-10-20 ENCOUNTER — Telehealth: Payer: Self-pay | Admitting: Gastroenterology

## 2020-10-20 NOTE — Telephone Encounter (Signed)
Patient called requesting to reschedule CT

## 2020-10-20 NOTE — Telephone Encounter (Signed)
Left detailed message letting patient know that she is more than welcome to reschedule her CT scan at her convenience. I have provided her with [facility]'s number [phone]) so they can assist her in rescheduling. Advised patient to call us back if she had any further concerns.

## 2020-10-21 LAB — PANCREATIC ELASTASE, FECAL: Pancreatic Elastase-1, Stool: 496 mcg/g

## 2020-10-22 ENCOUNTER — Other Ambulatory Visit: Payer: Self-pay

## 2020-10-22 ENCOUNTER — Ambulatory Visit (HOSPITAL_BASED_OUTPATIENT_CLINIC_OR_DEPARTMENT_OTHER)
Admission: RE | Admit: 2020-10-22 | Discharge: 2020-10-22 | Disposition: A | Discharge status: Home / Self Care | Payer: Medicare Other | Source: Ambulatory Visit | Attending: Gastroenterology | Admitting: Gastroenterology

## 2020-10-22 DIAGNOSIS — R1010 Upper abdominal pain, unspecified: Secondary | ICD-10-CM

## 2020-10-22 DIAGNOSIS — R197 Diarrhea, unspecified: Secondary | ICD-10-CM

## 2020-10-22 DIAGNOSIS — K760 Fatty (change of) liver, not elsewhere classified: Secondary | ICD-10-CM | POA: Diagnosis not present

## 2020-10-22 DIAGNOSIS — R140 Abdominal distension (gaseous): Secondary | ICD-10-CM | POA: Diagnosis not present

## 2020-10-22 DIAGNOSIS — R194 Change in bowel habit: Secondary | ICD-10-CM | POA: Diagnosis not present

## 2020-10-22 DIAGNOSIS — Z800 Family history of malignant neoplasm of digestive organs: Secondary | ICD-10-CM | POA: Diagnosis not present

## 2020-10-22 MED ORDER — IOHEXOL 300 MG/ML  SOLN
100.0000 mL | Freq: Once | INTRAMUSCULAR | Status: AC | PRN
  Administered 2020-10-22: 100 mL via INTRAVENOUS

## 2020-10-27 DIAGNOSIS — Z7689 Persons encountering health services in other specified circumstances: Secondary | ICD-10-CM | POA: Diagnosis not present

## 2020-10-27 DIAGNOSIS — Z1231 Encounter for screening mammogram for malignant neoplasm of breast: Secondary | ICD-10-CM | POA: Diagnosis not present

## 2020-11-27 IMAGING — US US THYROID
1 series · 13 of 25 positions shown · non-contrast
Comparison: Most recent prior thyroid ultrasound 07/03/2017

CLINICAL DATA: Goiter. 67-year-old female with a history of
multinodular thyroid goiter. She has previously undergone biopsy of
complex cystic and solid nodules in the right gland.

EXAM:
THYROID ULTRASOUND
TECHNIQUE: Ultrasound examination of the thyroid gland and adjacent soft
tissues was performed.

[Series 1: us thyroid · 0.08mm/px · 68 acquisitions, 13 frames shown]
[im 1/68]
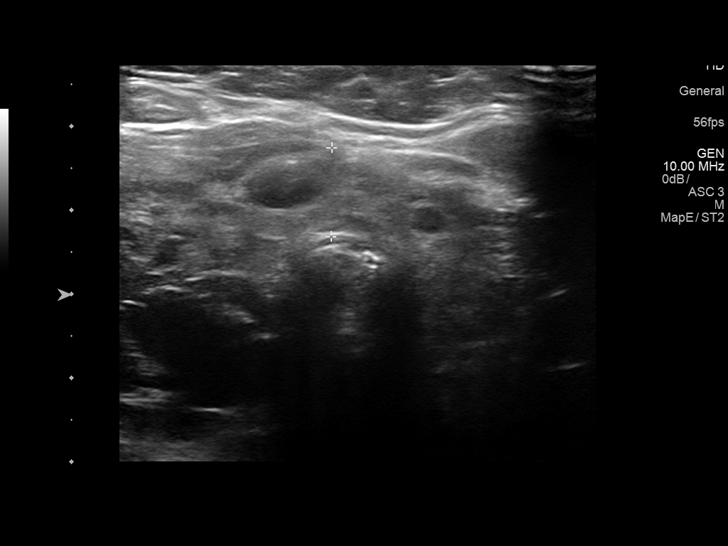
[im 6/68]
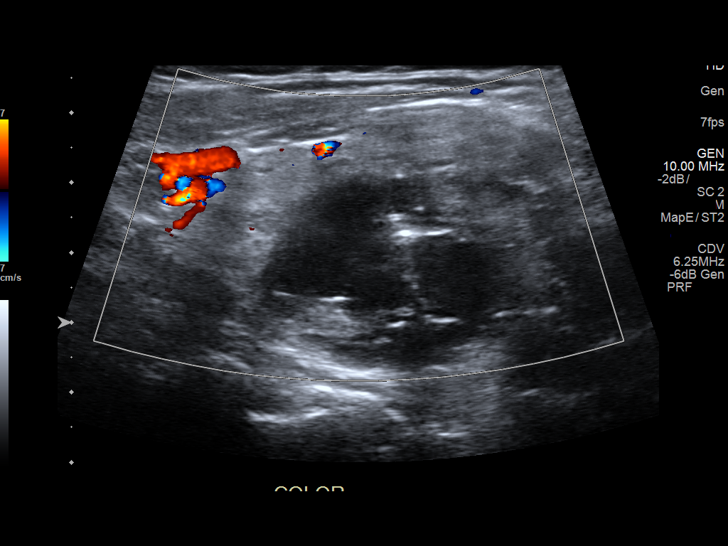
[im 12/68]
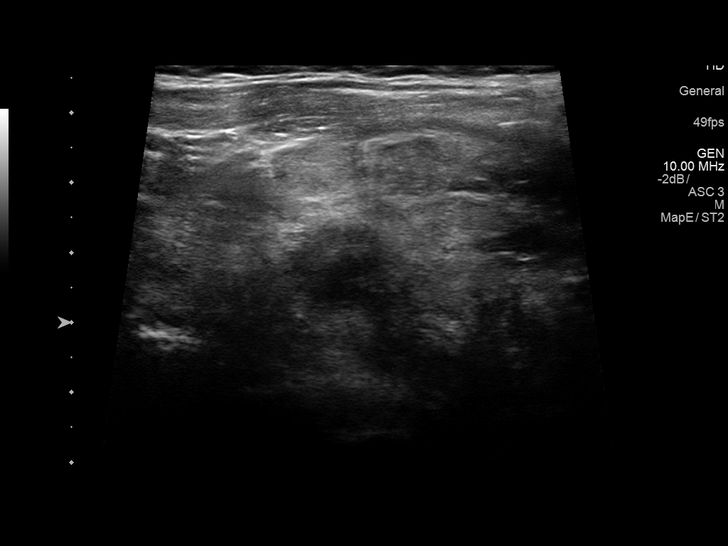
[im 17/68]
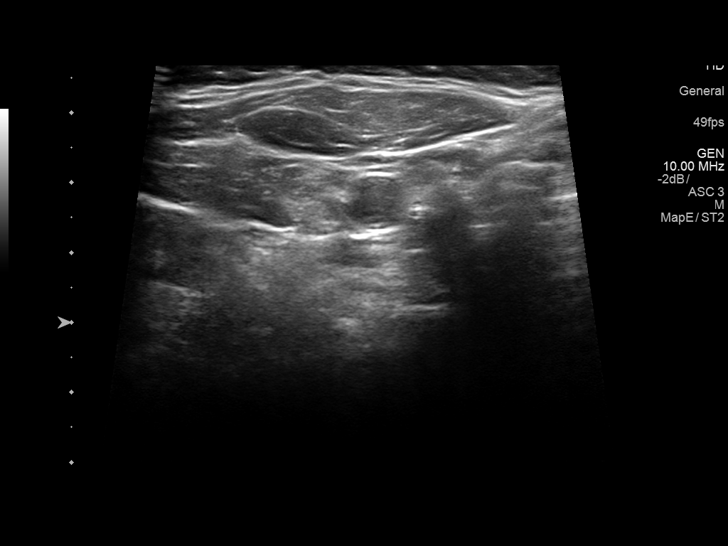
[im 23/68]
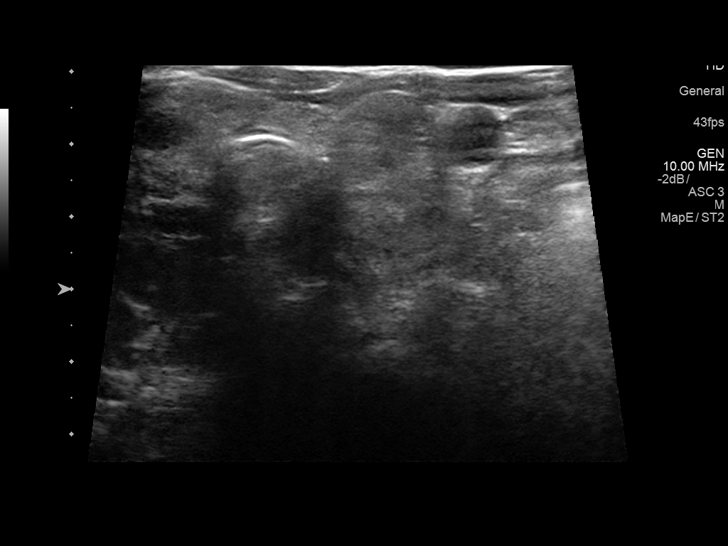
[im 28/68]
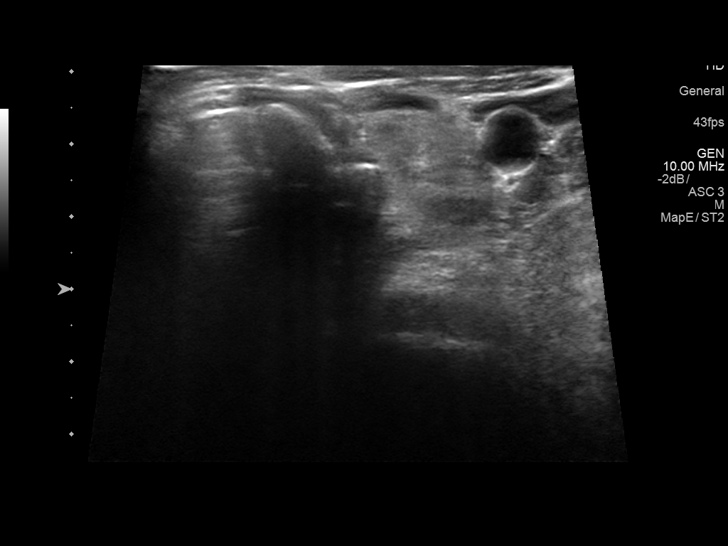
[im 34/68]
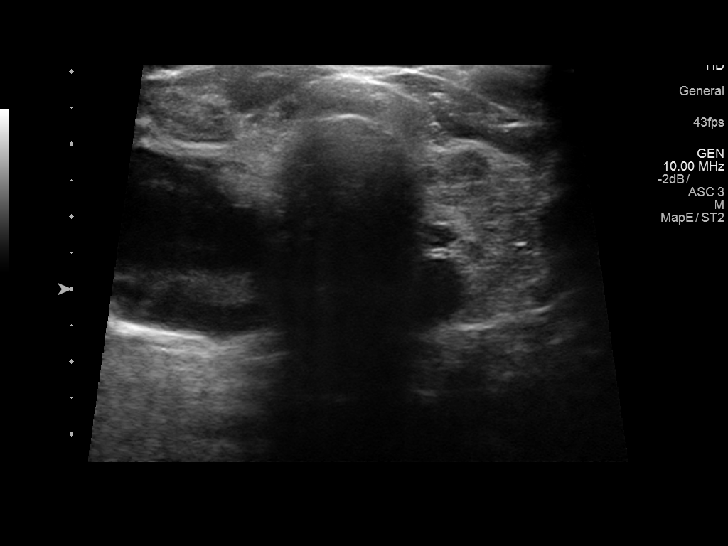
[im 40/68]
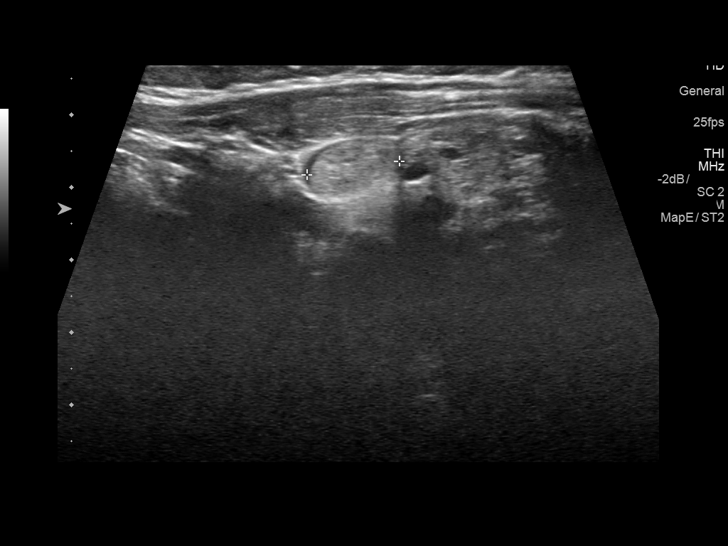
[im 45/68]
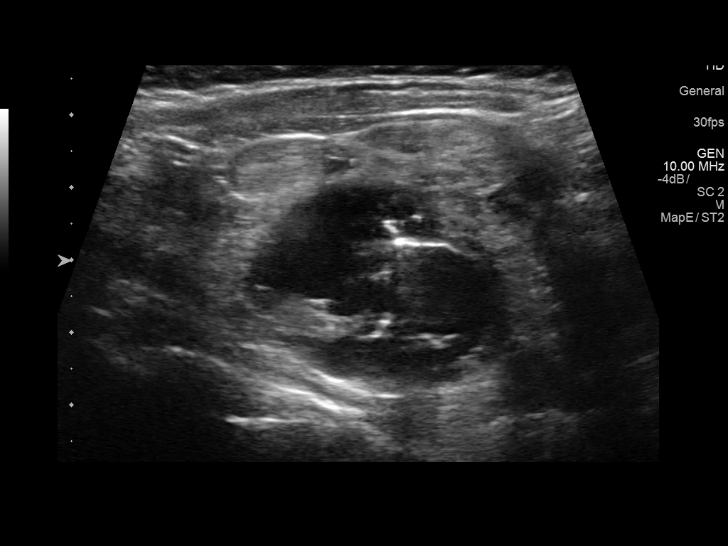
[im 51/68]
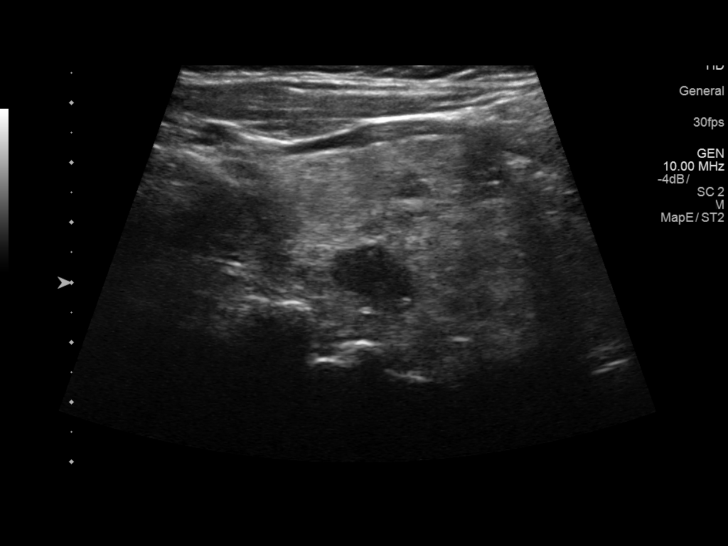
[im 56/68]
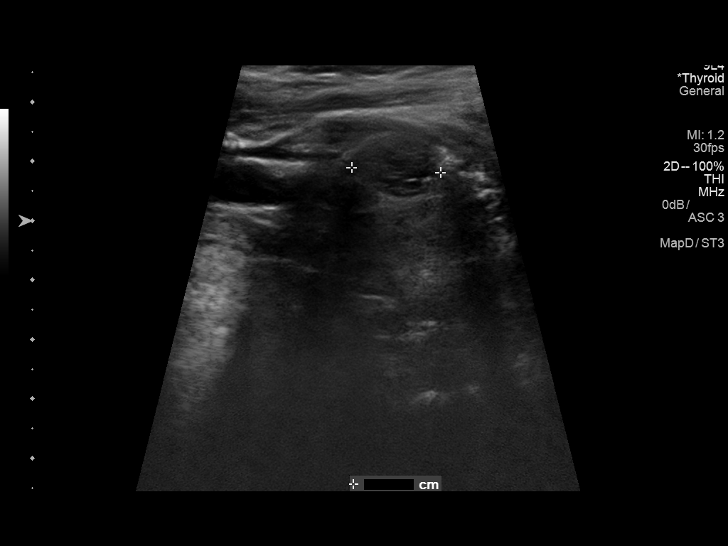
[im 62/68]
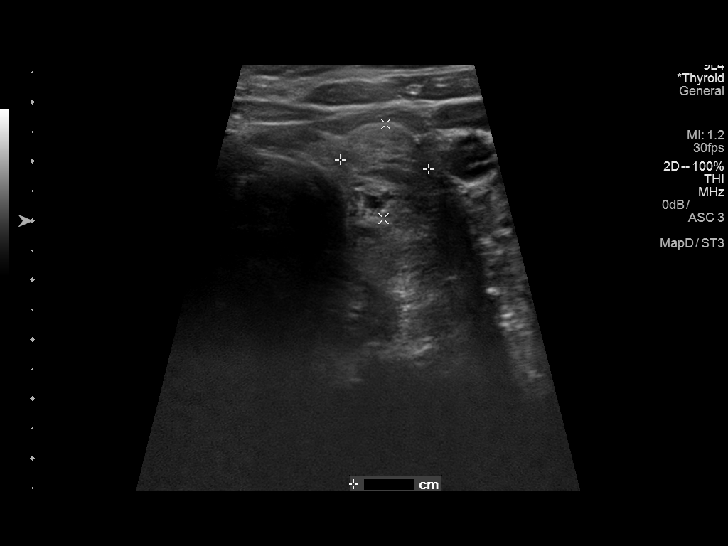
[im 68/68]
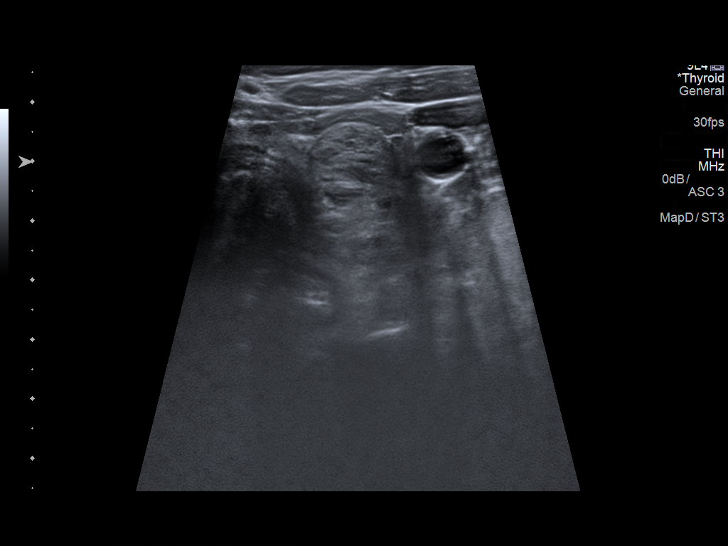

[13 of 25 positions shown; findings below may reference images not displayed]

FINDINGS: Parenchymal Echotexture: Markedly heterogenous

Isthmus: 1.1 cm

Right lobe: 6.0 x 4.3 x 3.6 cm

Left lobe: 5.5 x 3.3 x 1.9 cm

_________________________________________________________

Estimated total number of nodules >/= 1 cm: 5

Number of spongiform nodules >/=  2 cm not described below (TR1): 0

Number of mixed cystic and solid nodules >/= 1.5 cm not described
below (TR2): 0

_________________________________________________________

Nodule # 2: The previously identified 1.3 cm TI-RADS category 4
nodule in the right superior gland is again identified on today's
examination. However, today the nodule is isoechoic rather than a
hyperechoic and demonstrates no interval change in size. This is now
considered a TI-RADS category 3 nodule in no longer meets criteria
for additional surveillance.

_________________________________________________________

Nodule # 3: Previously identified 1.8 cm TI-RADS category 3 nodule
in the left superior gland has decreased in size and now measures no
larger than 1.1 cm in maximal diameter. Involution over time is
consistent with benignity. No further follow-up required.

_________________________________________________________

The previously biopsied predominantly cystic nodule in the right mid
gland measures 3.7 x 3.2 x 2.4 cm, minimally changed compared to
x 3.3 x 2.3 cm previously. Numerous additional spongiform and cystic
nodules present bilaterally. None of these nodules meet criteria for
further evaluation.
IMPRESSION: 1. Interval reclassification of the previously identified nodule in
the right superior gland to TI-RADS category 3. This nodule no
longer meets criteria for further follow-up.
2. Decreasing size of the previously identified nodule in the left
superior gland consistent with a benign process. No further
follow-up required.
3. No significant interval change in the size or appearance of the
previously biopsied complex cystic nodule in the right mid gland.
4. Numerous additional bilateral cysts, benign spongiform nodules
and small thyroid nodules throughout the thyroid gland none of which
meet criteria for biopsy or dedicated imaging follow-up.

The above is in keeping with the ACR TI-RADS recommendations - [HOSPITAL] 5685;[DATE].

## 2020-12-23 DIAGNOSIS — M5459 Other low back pain: Secondary | ICD-10-CM | POA: Diagnosis not present

## 2020-12-23 DIAGNOSIS — K760 Fatty (change of) liver, not elsewhere classified: Secondary | ICD-10-CM | POA: Diagnosis not present

## 2020-12-23 DIAGNOSIS — E876 Hypokalemia: Secondary | ICD-10-CM | POA: Diagnosis not present

## 2020-12-23 DIAGNOSIS — M545 Low back pain, unspecified: Secondary | ICD-10-CM | POA: Diagnosis not present

## 2021-01-21 DIAGNOSIS — Z01419 Encounter for gynecological examination (general) (routine) without abnormal findings: Secondary | ICD-10-CM | POA: Diagnosis not present

## 2021-01-21 DIAGNOSIS — Z6835 Body mass index (BMI) 35.0-35.9, adult: Secondary | ICD-10-CM | POA: Diagnosis not present

## 2021-01-25 ENCOUNTER — Other Ambulatory Visit: Payer: Self-pay | Admitting: Obstetrics and Gynecology

## 2021-01-25 DIAGNOSIS — E042 Nontoxic multinodular goiter: Secondary | ICD-10-CM

## 2021-02-01 ENCOUNTER — Other Ambulatory Visit: Payer: Medicare Other

## 2021-02-03 ENCOUNTER — Ambulatory Visit
Admission: RE | Admit: 2021-02-03 | Discharge: 2021-02-03 | Disposition: A | Discharge status: Home / Self Care | Payer: Medicare Other | Source: Ambulatory Visit | Attending: Obstetrics and Gynecology | Admitting: Obstetrics and Gynecology

## 2021-02-03 DIAGNOSIS — E042 Nontoxic multinodular goiter: Secondary | ICD-10-CM

## 2021-02-03 DIAGNOSIS — E041 Nontoxic single thyroid nodule: Secondary | ICD-10-CM | POA: Diagnosis not present

## 2021-02-24 DIAGNOSIS — H02889 Meibomian gland dysfunction of unspecified eye, unspecified eyelid: Secondary | ICD-10-CM | POA: Diagnosis not present

## 2021-02-24 DIAGNOSIS — K219 Gastro-esophageal reflux disease without esophagitis: Secondary | ICD-10-CM | POA: Diagnosis not present

## 2021-02-24 DIAGNOSIS — E119 Type 2 diabetes mellitus without complications: Secondary | ICD-10-CM | POA: Diagnosis not present

## 2021-02-24 DIAGNOSIS — I10 Essential (primary) hypertension: Secondary | ICD-10-CM | POA: Diagnosis not present

## 2021-02-24 DIAGNOSIS — H26491 Other secondary cataract, right eye: Secondary | ICD-10-CM | POA: Diagnosis not present

## 2021-02-24 DIAGNOSIS — E7800 Pure hypercholesterolemia, unspecified: Secondary | ICD-10-CM | POA: Diagnosis not present

## 2021-02-24 DIAGNOSIS — E1169 Type 2 diabetes mellitus with other specified complication: Secondary | ICD-10-CM | POA: Diagnosis not present

## 2021-02-24 DIAGNOSIS — Z961 Presence of intraocular lens: Secondary | ICD-10-CM | POA: Diagnosis not present

## 2021-02-24 DIAGNOSIS — H04123 Dry eye syndrome of bilateral lacrimal glands: Secondary | ICD-10-CM | POA: Diagnosis not present

## 2021-02-25 DIAGNOSIS — Z23 Encounter for immunization: Secondary | ICD-10-CM | POA: Diagnosis not present

## 2021-03-19 DIAGNOSIS — E1169 Type 2 diabetes mellitus with other specified complication: Secondary | ICD-10-CM | POA: Diagnosis not present

## 2021-03-19 DIAGNOSIS — E7800 Pure hypercholesterolemia, unspecified: Secondary | ICD-10-CM | POA: Diagnosis not present

## 2021-03-19 DIAGNOSIS — E042 Nontoxic multinodular goiter: Secondary | ICD-10-CM | POA: Diagnosis not present

## 2021-03-19 DIAGNOSIS — Z7984 Long term (current) use of oral hypoglycemic drugs: Secondary | ICD-10-CM | POA: Diagnosis not present

## 2021-03-19 DIAGNOSIS — I10 Essential (primary) hypertension: Secondary | ICD-10-CM | POA: Diagnosis not present

## 2021-03-25 DIAGNOSIS — Z20822 Contact with and (suspected) exposure to covid-19: Secondary | ICD-10-CM | POA: Diagnosis not present

## 2021-04-06 DIAGNOSIS — J101 Influenza due to other identified influenza virus with other respiratory manifestations: Secondary | ICD-10-CM | POA: Diagnosis not present

## 2021-04-06 DIAGNOSIS — Z20822 Contact with and (suspected) exposure to covid-19: Secondary | ICD-10-CM | POA: Diagnosis not present

## 2021-05-12 DIAGNOSIS — E1169 Type 2 diabetes mellitus with other specified complication: Secondary | ICD-10-CM | POA: Diagnosis not present

## 2021-05-12 DIAGNOSIS — I10 Essential (primary) hypertension: Secondary | ICD-10-CM | POA: Diagnosis not present

## 2021-05-12 DIAGNOSIS — E0590 Thyrotoxicosis, unspecified without thyrotoxic crisis or storm: Secondary | ICD-10-CM | POA: Diagnosis not present

## 2021-05-12 DIAGNOSIS — Z8739 Personal history of other diseases of the musculoskeletal system and connective tissue: Secondary | ICD-10-CM | POA: Diagnosis not present

## 2021-05-12 DIAGNOSIS — E042 Nontoxic multinodular goiter: Secondary | ICD-10-CM | POA: Diagnosis not present

## 2021-05-12 DIAGNOSIS — E785 Hyperlipidemia, unspecified: Secondary | ICD-10-CM | POA: Diagnosis not present

## 2021-06-01 DIAGNOSIS — Z23 Encounter for immunization: Secondary | ICD-10-CM | POA: Diagnosis not present

## 2021-06-11 DIAGNOSIS — S39012A Strain of muscle, fascia and tendon of lower back, initial encounter: Secondary | ICD-10-CM | POA: Diagnosis not present

## 2021-06-14 DIAGNOSIS — Z20822 Contact with and (suspected) exposure to covid-19: Secondary | ICD-10-CM | POA: Diagnosis not present

## 2021-07-09 DIAGNOSIS — Z23 Encounter for immunization: Secondary | ICD-10-CM | POA: Diagnosis not present

## 2021-07-16 DIAGNOSIS — E0590 Thyrotoxicosis, unspecified without thyrotoxic crisis or storm: Secondary | ICD-10-CM | POA: Diagnosis not present

## 2021-07-20 DIAGNOSIS — Z20822 Contact with and (suspected) exposure to covid-19: Secondary | ICD-10-CM | POA: Diagnosis not present

## 2021-07-21 DIAGNOSIS — E042 Nontoxic multinodular goiter: Secondary | ICD-10-CM | POA: Diagnosis not present

## 2021-07-21 DIAGNOSIS — I10 Essential (primary) hypertension: Secondary | ICD-10-CM | POA: Diagnosis not present

## 2021-07-21 DIAGNOSIS — E0590 Thyrotoxicosis, unspecified without thyrotoxic crisis or storm: Secondary | ICD-10-CM | POA: Diagnosis not present

## 2021-07-21 DIAGNOSIS — Z8739 Personal history of other diseases of the musculoskeletal system and connective tissue: Secondary | ICD-10-CM | POA: Diagnosis not present

## 2021-07-21 DIAGNOSIS — E785 Hyperlipidemia, unspecified: Secondary | ICD-10-CM | POA: Diagnosis not present

## 2021-07-21 DIAGNOSIS — E1169 Type 2 diabetes mellitus with other specified complication: Secondary | ICD-10-CM | POA: Diagnosis not present

## 2021-07-22 DIAGNOSIS — Z20822 Contact with and (suspected) exposure to covid-19: Secondary | ICD-10-CM | POA: Diagnosis not present

## 2021-08-05 DIAGNOSIS — Z23 Encounter for immunization: Secondary | ICD-10-CM | POA: Diagnosis not present

## 2021-08-10 DIAGNOSIS — Z79899 Other long term (current) drug therapy: Secondary | ICD-10-CM | POA: Diagnosis not present

## 2021-08-10 DIAGNOSIS — M1A09X0 Idiopathic chronic gout, multiple sites, without tophus (tophi): Secondary | ICD-10-CM | POA: Diagnosis not present

## 2021-08-11 DIAGNOSIS — Z20822 Contact with and (suspected) exposure to covid-19: Secondary | ICD-10-CM | POA: Diagnosis not present

## 2021-08-23 DIAGNOSIS — Z20822 Contact with and (suspected) exposure to covid-19: Secondary | ICD-10-CM | POA: Diagnosis not present

## 2021-08-26 DIAGNOSIS — Z23 Encounter for immunization: Secondary | ICD-10-CM | POA: Diagnosis not present

## 2021-09-29 DIAGNOSIS — E0590 Thyrotoxicosis, unspecified without thyrotoxic crisis or storm: Secondary | ICD-10-CM | POA: Diagnosis not present

## 2021-10-05 DIAGNOSIS — E1169 Type 2 diabetes mellitus with other specified complication: Secondary | ICD-10-CM | POA: Diagnosis not present

## 2021-10-05 DIAGNOSIS — E0590 Thyrotoxicosis, unspecified without thyrotoxic crisis or storm: Secondary | ICD-10-CM | POA: Diagnosis not present

## 2021-10-20 DIAGNOSIS — E0590 Thyrotoxicosis, unspecified without thyrotoxic crisis or storm: Secondary | ICD-10-CM | POA: Diagnosis not present

## 2021-10-26 DIAGNOSIS — Z6835 Body mass index (BMI) 35.0-35.9, adult: Secondary | ICD-10-CM | POA: Diagnosis not present

## 2021-10-26 DIAGNOSIS — E1169 Type 2 diabetes mellitus with other specified complication: Secondary | ICD-10-CM | POA: Diagnosis not present

## 2021-10-26 DIAGNOSIS — E7800 Pure hypercholesterolemia, unspecified: Secondary | ICD-10-CM | POA: Diagnosis not present

## 2021-10-26 DIAGNOSIS — I10 Essential (primary) hypertension: Secondary | ICD-10-CM | POA: Diagnosis not present

## 2021-10-26 DIAGNOSIS — G4733 Obstructive sleep apnea (adult) (pediatric): Secondary | ICD-10-CM | POA: Diagnosis not present

## 2021-10-26 DIAGNOSIS — Z23 Encounter for immunization: Secondary | ICD-10-CM | POA: Diagnosis not present

## 2021-10-26 DIAGNOSIS — M109 Gout, unspecified: Secondary | ICD-10-CM | POA: Diagnosis not present

## 2021-10-26 DIAGNOSIS — Z0000 Encounter for general adult medical examination without abnormal findings: Secondary | ICD-10-CM | POA: Diagnosis not present

## 2021-10-26 DIAGNOSIS — K219 Gastro-esophageal reflux disease without esophagitis: Secondary | ICD-10-CM | POA: Diagnosis not present

## 2021-10-26 DIAGNOSIS — E0590 Thyrotoxicosis, unspecified without thyrotoxic crisis or storm: Secondary | ICD-10-CM | POA: Diagnosis not present

## 2021-10-26 DIAGNOSIS — Z1331 Encounter for screening for depression: Secondary | ICD-10-CM | POA: Diagnosis not present

## 2021-11-17 DIAGNOSIS — Z1231 Encounter for screening mammogram for malignant neoplasm of breast: Secondary | ICD-10-CM | POA: Diagnosis not present

## 2021-11-17 DIAGNOSIS — N958 Other specified menopausal and perimenopausal disorders: Secondary | ICD-10-CM | POA: Diagnosis not present

## 2021-11-17 DIAGNOSIS — Z8262 Family history of osteoporosis: Secondary | ICD-10-CM | POA: Diagnosis not present

## 2021-12-07 DIAGNOSIS — W108XXA Fall (on) (from) other stairs and steps, initial encounter: Secondary | ICD-10-CM | POA: Diagnosis not present

## 2021-12-07 DIAGNOSIS — M25561 Pain in right knee: Secondary | ICD-10-CM | POA: Diagnosis not present

## 2021-12-07 DIAGNOSIS — M25572 Pain in left ankle and joints of left foot: Secondary | ICD-10-CM | POA: Diagnosis not present

## 2021-12-07 DIAGNOSIS — M7989 Other specified soft tissue disorders: Secondary | ICD-10-CM | POA: Diagnosis not present

## 2021-12-07 DIAGNOSIS — S8262XA Displaced fracture of lateral malleolus of left fibula, initial encounter for closed fracture: Secondary | ICD-10-CM | POA: Diagnosis not present

## 2021-12-17 DIAGNOSIS — S8262XA Displaced fracture of lateral malleolus of left fibula, initial encounter for closed fracture: Secondary | ICD-10-CM | POA: Diagnosis not present

## 2021-12-17 DIAGNOSIS — M25572 Pain in left ankle and joints of left foot: Secondary | ICD-10-CM | POA: Diagnosis not present

## 2021-12-17 DIAGNOSIS — E119 Type 2 diabetes mellitus without complications: Secondary | ICD-10-CM | POA: Diagnosis not present

## 2021-12-17 DIAGNOSIS — Z6836 Body mass index (BMI) 36.0-36.9, adult: Secondary | ICD-10-CM | POA: Diagnosis not present

## 2021-12-17 DIAGNOSIS — S8001XA Contusion of right knee, initial encounter: Secondary | ICD-10-CM | POA: Diagnosis not present

## 2021-12-17 DIAGNOSIS — W108XXA Fall (on) (from) other stairs and steps, initial encounter: Secondary | ICD-10-CM | POA: Diagnosis not present

## 2022-01-04 IMAGING — CR DG KNEE COMPLETE 4+V*L*
4 series · 4 of 4 positions shown · non-contrast
Comparison: April 03, 2013.

CLINICAL DATA: Swelling.  Possible effusion.

EXAM:
LEFT KNEE - COMPLETE 4+ VIEW

[t knee ap left]
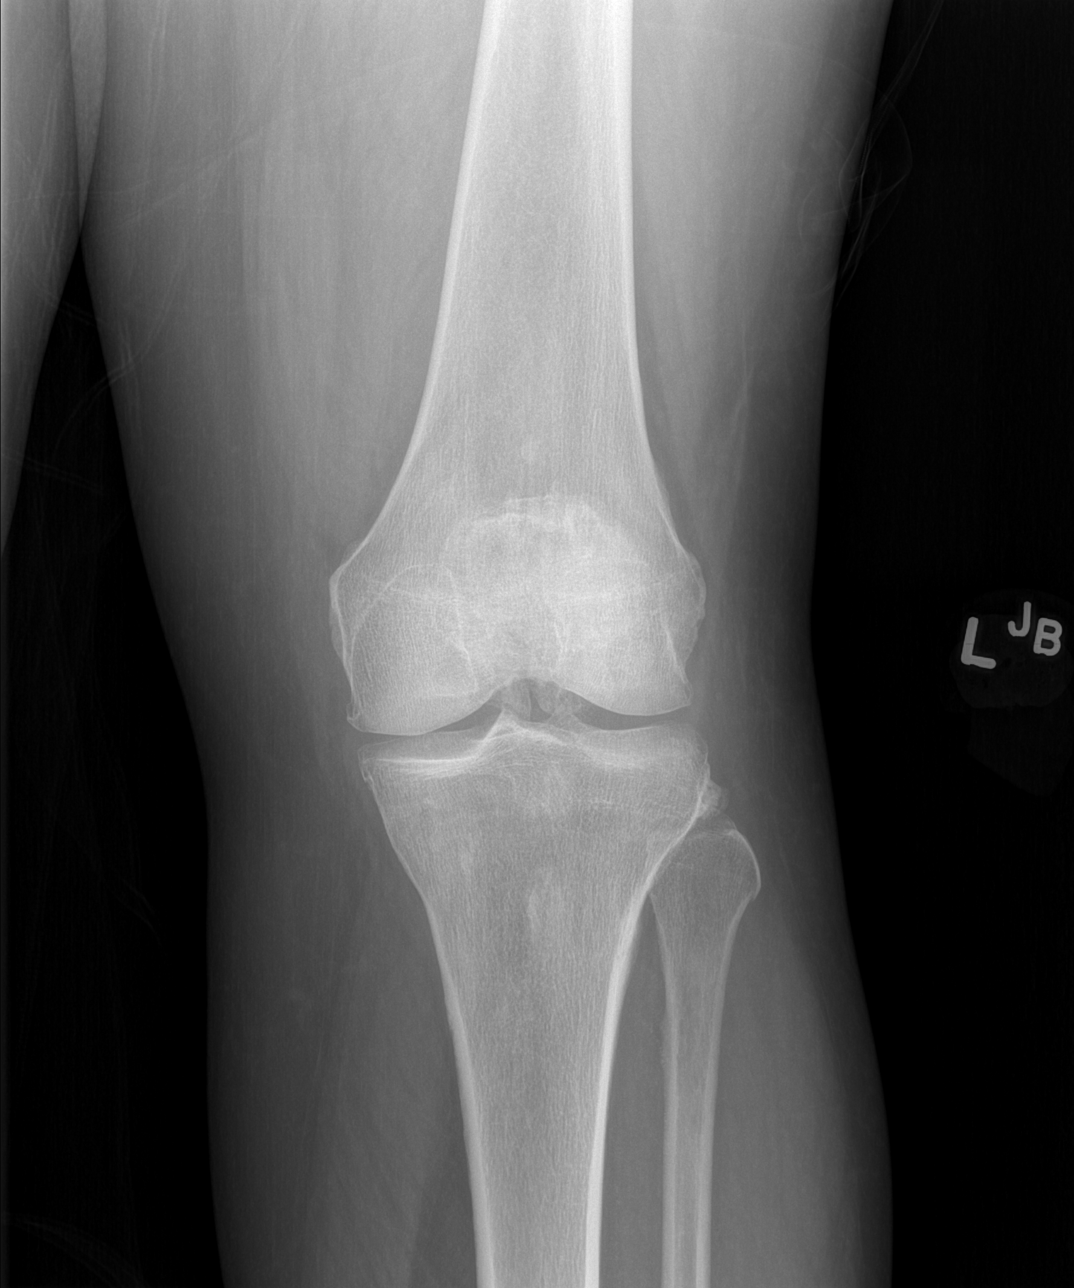

[t knee oblique left (1 of 2)]
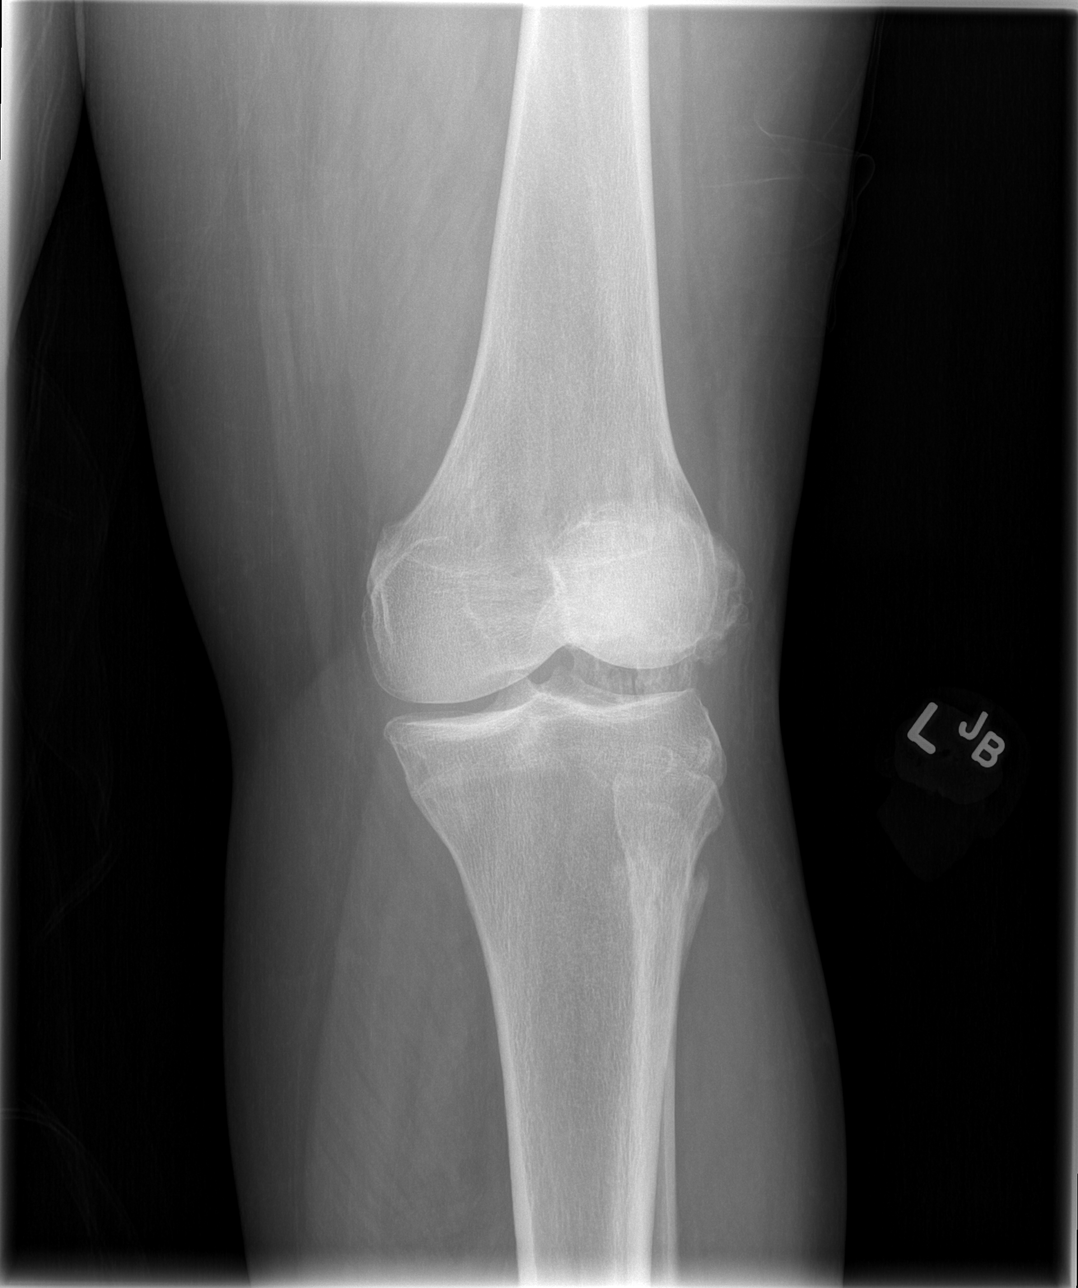

[t knee oblique left (2 of 2)]
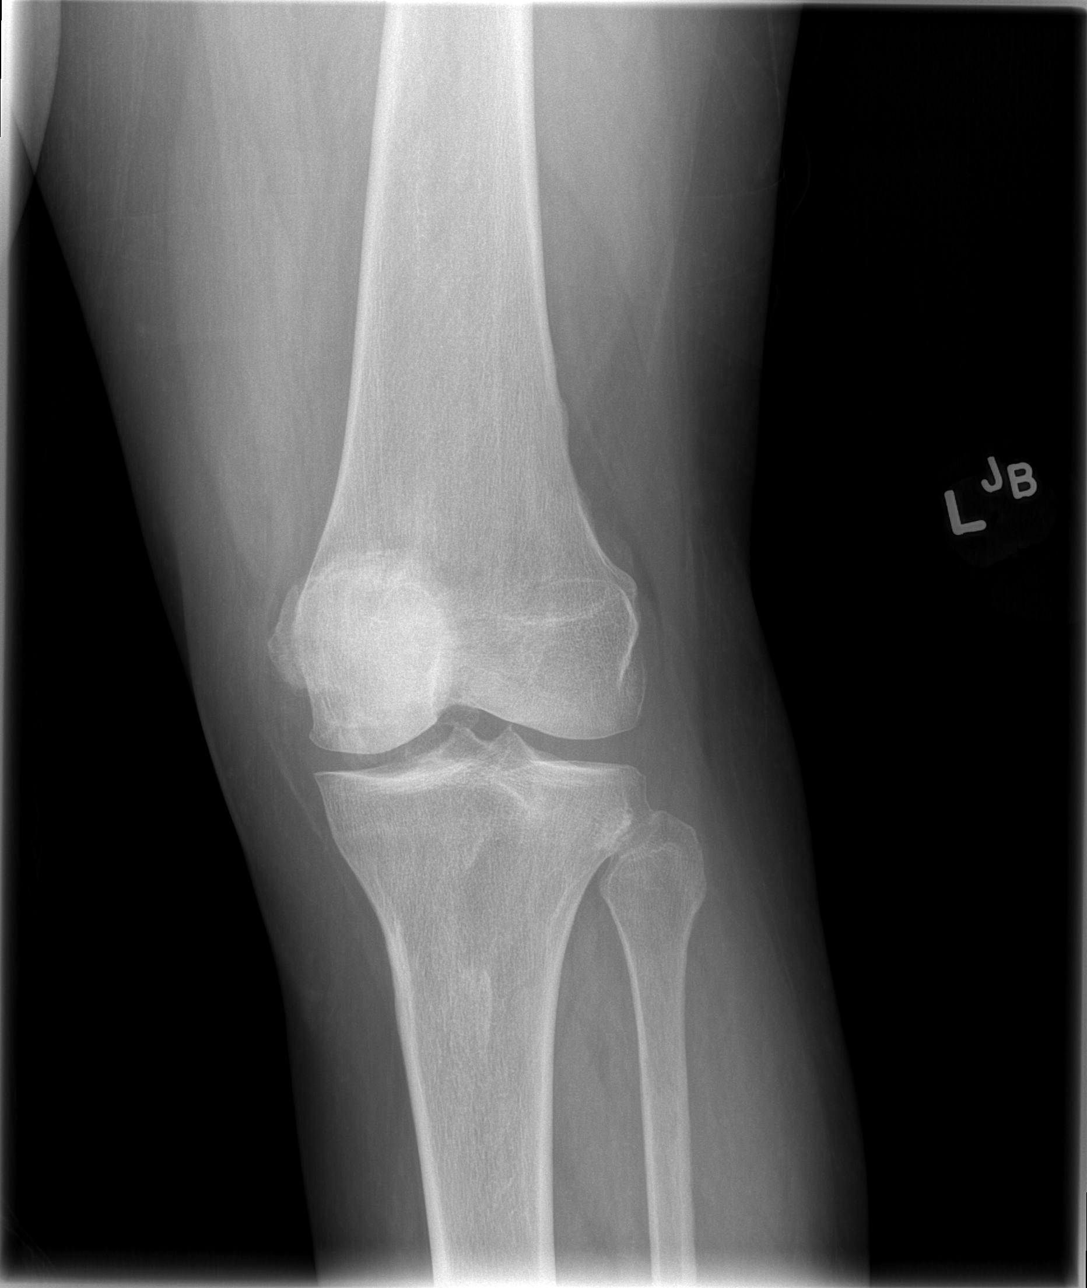

[t knee lat left]
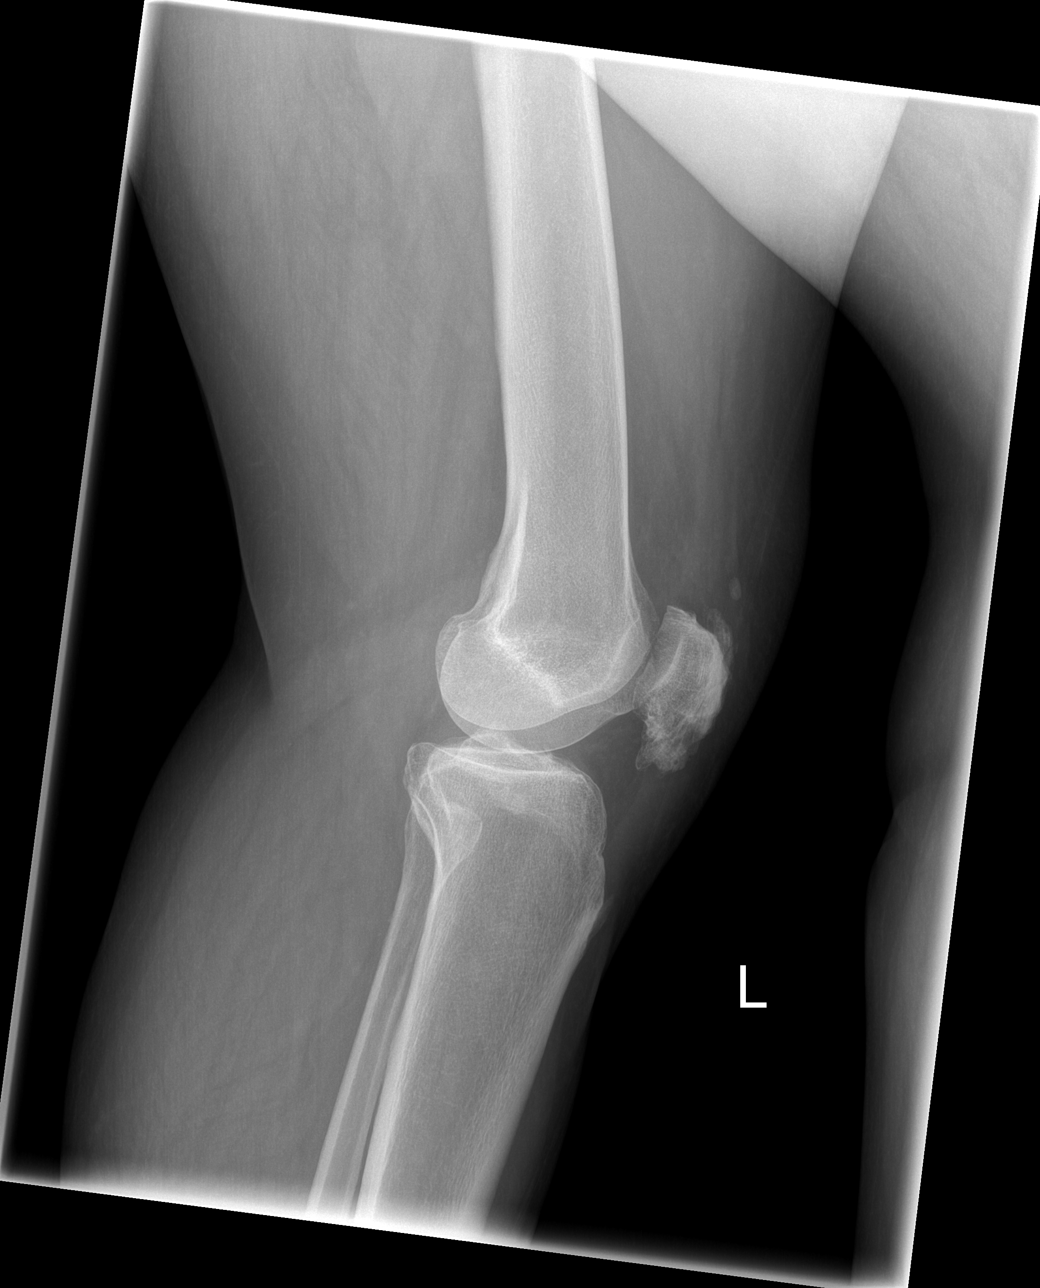

[4 of 4 positions shown; findings below may reference images not displayed]

FINDINGS: There is no acute displaced fracture or dislocation. Minimal
multicompartmental degenerative changes are noted. There is no
significant joint effusion. Post-traumatic spurring is noted of the
inferior aspect of the patella.
IMPRESSION: No acute osseous abnormality. No evidence for significant joint
effusion.

## 2022-01-06 DIAGNOSIS — U071 COVID-19: Secondary | ICD-10-CM | POA: Diagnosis not present

## 2022-01-06 DIAGNOSIS — J988 Other specified respiratory disorders: Secondary | ICD-10-CM | POA: Diagnosis not present

## 2022-01-06 DIAGNOSIS — R051 Acute cough: Secondary | ICD-10-CM | POA: Diagnosis not present

## 2022-01-12 DIAGNOSIS — E0590 Thyrotoxicosis, unspecified without thyrotoxic crisis or storm: Secondary | ICD-10-CM | POA: Diagnosis not present

## 2022-01-14 DIAGNOSIS — S8262XD Displaced fracture of lateral malleolus of left fibula, subsequent encounter for closed fracture with routine healing: Secondary | ICD-10-CM | POA: Diagnosis not present

## 2022-01-14 DIAGNOSIS — M25572 Pain in left ankle and joints of left foot: Secondary | ICD-10-CM | POA: Diagnosis not present

## 2022-01-14 DIAGNOSIS — R7989 Other specified abnormal findings of blood chemistry: Secondary | ICD-10-CM | POA: Diagnosis not present

## 2022-01-27 DIAGNOSIS — Z6834 Body mass index (BMI) 34.0-34.9, adult: Secondary | ICD-10-CM | POA: Diagnosis not present

## 2022-01-27 DIAGNOSIS — Z808 Family history of malignant neoplasm of other organs or systems: Secondary | ICD-10-CM | POA: Diagnosis not present

## 2022-01-27 DIAGNOSIS — Z86010 Personal history of colonic polyps: Secondary | ICD-10-CM | POA: Diagnosis not present

## 2022-01-27 DIAGNOSIS — Z01419 Encounter for gynecological examination (general) (routine) without abnormal findings: Secondary | ICD-10-CM | POA: Diagnosis not present

## 2022-01-28 ENCOUNTER — Other Ambulatory Visit: Payer: Self-pay | Admitting: Obstetrics and Gynecology

## 2022-01-28 DIAGNOSIS — Z800 Family history of malignant neoplasm of digestive organs: Secondary | ICD-10-CM

## 2022-02-22 DIAGNOSIS — E0590 Thyrotoxicosis, unspecified without thyrotoxic crisis or storm: Secondary | ICD-10-CM | POA: Diagnosis not present

## 2022-02-23 ENCOUNTER — Ambulatory Visit
Admission: RE | Admit: 2022-02-23 | Discharge: 2022-02-23 | Disposition: A | Discharge status: Home / Self Care | Payer: Medicare Other | Source: Ambulatory Visit | Attending: Obstetrics and Gynecology | Admitting: Obstetrics and Gynecology

## 2022-02-23 DIAGNOSIS — Z800 Family history of malignant neoplasm of digestive organs: Secondary | ICD-10-CM

## 2022-02-23 MED ORDER — GADOPICLENOL 0.5 MMOL/ML IV SOLN
10.0000 mL | Freq: Once | INTRAVENOUS | Status: AC | PRN
  Administered 2022-02-23: 10 mL via INTRAVENOUS

## 2022-03-15 DIAGNOSIS — Z800 Family history of malignant neoplasm of digestive organs: Secondary | ICD-10-CM | POA: Diagnosis not present

## 2022-03-24 DIAGNOSIS — Z23 Encounter for immunization: Secondary | ICD-10-CM | POA: Diagnosis not present

## 2022-04-06 ENCOUNTER — Other Ambulatory Visit (HOSPITAL_COMMUNITY): Payer: Self-pay | Admitting: Nurse Practitioner

## 2022-04-06 DIAGNOSIS — E0590 Thyrotoxicosis, unspecified without thyrotoxic crisis or storm: Secondary | ICD-10-CM

## 2022-04-06 DIAGNOSIS — E042 Nontoxic multinodular goiter: Secondary | ICD-10-CM | POA: Diagnosis not present

## 2022-04-06 DIAGNOSIS — E1169 Type 2 diabetes mellitus with other specified complication: Secondary | ICD-10-CM | POA: Diagnosis not present

## 2022-04-29 DIAGNOSIS — E1169 Type 2 diabetes mellitus with other specified complication: Secondary | ICD-10-CM | POA: Diagnosis not present

## 2022-04-29 DIAGNOSIS — I10 Essential (primary) hypertension: Secondary | ICD-10-CM | POA: Diagnosis not present

## 2022-04-29 DIAGNOSIS — E7800 Pure hypercholesterolemia, unspecified: Secondary | ICD-10-CM | POA: Diagnosis not present

## 2022-04-29 DIAGNOSIS — E042 Nontoxic multinodular goiter: Secondary | ICD-10-CM | POA: Diagnosis not present

## 2022-04-29 DIAGNOSIS — E0590 Thyrotoxicosis, unspecified without thyrotoxic crisis or storm: Secondary | ICD-10-CM | POA: Diagnosis not present

## 2022-05-03 ENCOUNTER — Encounter (HOSPITAL_COMMUNITY)
Admission: RE | Admit: 2022-05-03 | Discharge: 2022-05-03 | Disposition: A | Discharge status: Home / Self Care | Payer: Medicare HMO | Source: Ambulatory Visit | Attending: Nurse Practitioner | Admitting: Nurse Practitioner

## 2022-05-03 ENCOUNTER — Encounter (HOSPITAL_COMMUNITY): Payer: Medicare HMO

## 2022-05-03 ENCOUNTER — Encounter (HOSPITAL_COMMUNITY): Payer: Self-pay

## 2022-05-03 DIAGNOSIS — E0590 Thyrotoxicosis, unspecified without thyrotoxic crisis or storm: Secondary | ICD-10-CM | POA: Insufficient documentation

## 2022-05-04 ENCOUNTER — Encounter (HOSPITAL_COMMUNITY): Payer: Self-pay

## 2022-05-04 ENCOUNTER — Encounter (HOSPITAL_COMMUNITY): Payer: Medicare HMO

## 2022-05-06 DIAGNOSIS — M65342 Trigger finger, left ring finger: Secondary | ICD-10-CM | POA: Diagnosis not present

## 2022-05-10 ENCOUNTER — Encounter (HOSPITAL_COMMUNITY)
Admission: RE | Admit: 2022-05-10 | Discharge: 2022-05-10 | Disposition: A | Discharge status: Home / Self Care | Payer: Medicare HMO | Source: Ambulatory Visit | Attending: Nurse Practitioner | Admitting: Nurse Practitioner

## 2022-05-10 DIAGNOSIS — E0590 Thyrotoxicosis, unspecified without thyrotoxic crisis or storm: Secondary | ICD-10-CM | POA: Diagnosis not present

## 2022-05-10 MED ORDER — SODIUM IODIDE I 131 CAPSULE
10.6000 | Freq: Once | INTRAVENOUS | Status: AC | PRN
  Administered 2022-05-10: 10.6 via ORAL

## 2022-05-11 ENCOUNTER — Encounter (HOSPITAL_COMMUNITY)
Admission: RE | Admit: 2022-05-11 | Discharge: 2022-05-11 | Disposition: A | Discharge status: Home / Self Care | Payer: Medicare HMO | Source: Ambulatory Visit | Attending: Nurse Practitioner

## 2022-05-11 MED ORDER — SODIUM PERTECHNETATE TC 99M INJECTION
4.1000 | Freq: Once | INTRAVENOUS | Status: AC | PRN
  Administered 2022-05-11: 4.1 via INTRAVENOUS

## 2022-05-13 DIAGNOSIS — E0590 Thyrotoxicosis, unspecified without thyrotoxic crisis or storm: Secondary | ICD-10-CM | POA: Diagnosis not present

## 2022-05-13 DIAGNOSIS — E042 Nontoxic multinodular goiter: Secondary | ICD-10-CM | POA: Diagnosis not present

## 2022-05-25 DIAGNOSIS — E0590 Thyrotoxicosis, unspecified without thyrotoxic crisis or storm: Secondary | ICD-10-CM | POA: Diagnosis not present

## 2022-05-25 DIAGNOSIS — E042 Nontoxic multinodular goiter: Secondary | ICD-10-CM | POA: Diagnosis not present

## 2022-05-26 DIAGNOSIS — H04123 Dry eye syndrome of bilateral lacrimal glands: Secondary | ICD-10-CM | POA: Diagnosis not present

## 2022-05-26 DIAGNOSIS — Z961 Presence of intraocular lens: Secondary | ICD-10-CM | POA: Diagnosis not present

## 2022-05-26 DIAGNOSIS — H524 Presbyopia: Secondary | ICD-10-CM | POA: Diagnosis not present

## 2022-05-26 DIAGNOSIS — H26491 Other secondary cataract, right eye: Secondary | ICD-10-CM | POA: Diagnosis not present

## 2022-05-26 DIAGNOSIS — E119 Type 2 diabetes mellitus without complications: Secondary | ICD-10-CM | POA: Diagnosis not present

## 2022-05-26 DIAGNOSIS — H02889 Meibomian gland dysfunction of unspecified eye, unspecified eyelid: Secondary | ICD-10-CM | POA: Diagnosis not present

## 2022-05-26 DIAGNOSIS — H52223 Regular astigmatism, bilateral: Secondary | ICD-10-CM | POA: Diagnosis not present

## 2022-06-10 ENCOUNTER — Other Ambulatory Visit (HOSPITAL_COMMUNITY): Payer: Self-pay | Admitting: Nurse Practitioner

## 2022-06-10 DIAGNOSIS — E0590 Thyrotoxicosis, unspecified without thyrotoxic crisis or storm: Secondary | ICD-10-CM

## 2022-06-10 NOTE — Written Directive (Cosign Needed)
MOLECULAR IMAGING AND THERAPEUTICS WRITTEN DIRECTIVE   PATIENT NAME: Wendy Ochoa  PT DOB:   09-22-1950                                              MRN: [id number]  ---------------------------------------------------------------------------------------------------------------------   I-131 WHOLE THYROID THERAPY (NON-CANCER)    RADIOPHARMACEUTICAL:   Iodine-131 Capsule    PRESCRIBED DOSE FOR ADMINISTRATION: 30 mCi   ROUTE OFADMINISTRATION: PO   DIAGNOSIS:  Hyperthyroidism - toxic multinodular goiter   REFERRING PHYSICIAN: Maricela Bo. Clair Gulling, FNP   TSH:    Lab Results  Component Value Date   TSH 0.965 02/12/2013     PRIOR I-131 THERAPY (Date and Dose):   PRIOR RADIOLOGY EXAMS (Results and Date): NM THYROID MULT UPTAKE W/IMAGING  Result Date: 05/13/2022 CLINICAL DATA:  Hyperthyroidism EXAM: THYROID SCAN AND UPTAKE - 4 AND 24 HOURS TECHNIQUE: Following the per oral administration of I-131 sodium iodide, the patient returned at 4 and 24 hours and uptake measurements were acquired with the uptake probe centered on the neck. Thyroid imaging was performed following the intravenous administration of Tc-34mPertechnetate. RADIOPHARMACEUTICALS:  4.1 mCi Technetium-999mertechnetate IV and 10.6 microCuries I-131 sodium iodide orally COMPARISON:  Thyroid ultrasound 02/03/2021 FINDINGS: Multiple areas of increased and decreased tracer uptake are seen throughout both thyroid lobes consistent with a multinodular thyroid gland. 4 hour I-131 uptake = 9.3% (normal 5-20%) 24 hour I-131 uptake = 43.2% (normal 10-30%) IMPRESSION: Multinodular thyroid gland with elevated 24 hour radio iodine uptake consistent with toxic nodular goiter. Electronically Signed   By: MaLavonia Dana.D.   On: 05/13/2022 08:32   [place]  Result Date: 02/03/2021 CLINICAL DATA:  Multinodular goiter Thyroid nodule follow-up EXAM: THYROID ULTRASOUND TECHNIQUE: Ultrasound examination of the thyroid gland and  adjacent soft tissues was performed. COMPARISON:  06/15/2018 FINDINGS: Parenchymal Echotexture: Moderately heterogenous Isthmus: 0.5 cm Right lobe: 7.3 x 3.7 x 3.4 cm Left lobe: 5.7 x 3.7 x 2.1 cm _________________________________________________________ Estimated total number of nodules >/= 1 cm: 7 Number of spongiform nodules >/=  2 cm not described below (TR1): 0 Number of mixed cystic and solid nodules >/= 1.5 cm not described below (TR2): 0 _________________________________________________________ Nodule 1: 1.0 x 1.0 x 0.7 cm cystic nodule in the isthmus does not meet criteria for FNA or imaging follow-up. _________________________________________________________ Nodule # 2: 1.5 x 1.4 x 0.7 cm solid isoechoic right superior thyroid nodule demonstrates very minimal growth since 03/25/2014 where it measured 1.1 x 1.1 x 0.9 cm. This favors a benign etiology. _________________________________________________________ Nodule 3: Mixed solid cystic isoechoic right mid thyroid nodule is not significant changed in size since prior examination and does not meet criteria for FNA or imaging surveillance. _________________________________________________________ Nodule 4: Spongiform nodule in the inferior right thyroid lobe measuring 1.7 x 1.7 x 0.8 cm does not meet criteria for FNA or imaging surveillance. _________________________________________________________ Nodule 5: 1.9 x 1.4 x 1.2 cm cystic left thyroid nodule does not meet criteria for FNA or imaging follow-up. _________________________________________________________ Nodule 6: 1.3 x 1.3 x 0.6 cm mixed solid cystic isoechoic nodule does not meet criteria for FNA or imaging follow-up. _________________________________________________________ Nodule 7: 1.4 x 1.4 x 1.3 cm solid hypoechoic nodule in the mid left thyroid lobe is not significantly changed in size dating back to 08/06/2013 where it measured 1.3 x 1.4 x 1.2 cm. Stability since the 2015  examination  indicates a benign etiology. IMPRESSION: Multiple bilateral thyroid nodules again seen. These nodules are either stable dating back to 2015 or do not meet criteria for FNA or imaging surveillance. No nodules identified which meet criteria for FNA or imaging surveillance. The above is in keeping with the ACR TI-RADS recommendations -  Am Coll Radiol 2017;14:587-595. Electronically Signed   By: Miachel Roux M.D.   On: 02/03/2021 14:12      ADDITIONAL PHYSICIAN COMMENTS/NOTES  Toxic MNG.  Trial of Methimazole.   AUTHORIZED USER SIGNATURE & TIME STAMP: Rennis Golden, MD   06/15/22    12:38 PM

## 2022-06-24 DIAGNOSIS — M65342 Trigger finger, left ring finger: Secondary | ICD-10-CM | POA: Diagnosis not present

## 2022-07-19 ENCOUNTER — Encounter (HOSPITAL_COMMUNITY): Payer: Self-pay | Admitting: Diagnostic Radiology

## 2022-07-19 ENCOUNTER — Encounter (HOSPITAL_COMMUNITY)
Admission: RE | Admit: 2022-07-19 | Discharge: 2022-07-19 | Disposition: A | Discharge status: Home / Self Care | Payer: Medicare HMO | Source: Ambulatory Visit | Attending: Nurse Practitioner | Admitting: Nurse Practitioner

## 2022-07-19 DIAGNOSIS — E0590 Thyrotoxicosis, unspecified without thyrotoxic crisis or storm: Secondary | ICD-10-CM | POA: Insufficient documentation

## 2022-07-19 DIAGNOSIS — E042 Nontoxic multinodular goiter: Secondary | ICD-10-CM | POA: Diagnosis not present

## 2022-07-19 MED ORDER — SODIUM IODIDE I 131 CAPSULE
28.3000 | Freq: Once | INTRAVENOUS | Status: AC | PRN
  Administered 2022-07-19: 28.3 via ORAL

## 2022-07-26 DIAGNOSIS — E042 Nontoxic multinodular goiter: Secondary | ICD-10-CM | POA: Diagnosis not present

## 2022-07-26 DIAGNOSIS — E0590 Thyrotoxicosis, unspecified without thyrotoxic crisis or storm: Secondary | ICD-10-CM | POA: Diagnosis not present

## 2022-08-11 DIAGNOSIS — Z79899 Other long term (current) drug therapy: Secondary | ICD-10-CM | POA: Diagnosis not present

## 2022-08-11 DIAGNOSIS — M1A09X0 Idiopathic chronic gout, multiple sites, without tophus (tophi): Secondary | ICD-10-CM | POA: Diagnosis not present

## 2022-09-26 DIAGNOSIS — E0590 Thyrotoxicosis, unspecified without thyrotoxic crisis or storm: Secondary | ICD-10-CM | POA: Diagnosis not present

## 2022-11-01 DIAGNOSIS — E0590 Thyrotoxicosis, unspecified without thyrotoxic crisis or storm: Secondary | ICD-10-CM | POA: Diagnosis not present

## 2022-11-01 DIAGNOSIS — I10 Essential (primary) hypertension: Secondary | ICD-10-CM | POA: Diagnosis not present

## 2022-11-01 DIAGNOSIS — Z0000 Encounter for general adult medical examination without abnormal findings: Secondary | ICD-10-CM | POA: Diagnosis not present

## 2022-11-01 DIAGNOSIS — G4733 Obstructive sleep apnea (adult) (pediatric): Secondary | ICD-10-CM | POA: Diagnosis not present

## 2022-11-01 DIAGNOSIS — E1169 Type 2 diabetes mellitus with other specified complication: Secondary | ICD-10-CM | POA: Diagnosis not present

## 2022-11-01 DIAGNOSIS — K219 Gastro-esophageal reflux disease without esophagitis: Secondary | ICD-10-CM | POA: Diagnosis not present

## 2022-11-01 DIAGNOSIS — E1122 Type 2 diabetes mellitus with diabetic chronic kidney disease: Secondary | ICD-10-CM | POA: Diagnosis not present

## 2022-11-01 DIAGNOSIS — M109 Gout, unspecified: Secondary | ICD-10-CM | POA: Diagnosis not present

## 2022-11-01 DIAGNOSIS — E7800 Pure hypercholesterolemia, unspecified: Secondary | ICD-10-CM | POA: Diagnosis not present

## 2022-11-01 DIAGNOSIS — N1831 Chronic kidney disease, stage 3a: Secondary | ICD-10-CM | POA: Diagnosis not present

## 2022-11-22 DIAGNOSIS — Z1231 Encounter for screening mammogram for malignant neoplasm of breast: Secondary | ICD-10-CM | POA: Diagnosis not present

## 2022-11-25 DIAGNOSIS — E0590 Thyrotoxicosis, unspecified without thyrotoxic crisis or storm: Secondary | ICD-10-CM | POA: Diagnosis not present

## 2022-11-30 DIAGNOSIS — J0191 Acute recurrent sinusitis, unspecified: Secondary | ICD-10-CM | POA: Diagnosis not present

## 2022-12-02 DIAGNOSIS — E0590 Thyrotoxicosis, unspecified without thyrotoxic crisis or storm: Secondary | ICD-10-CM | POA: Diagnosis not present

## 2022-12-02 DIAGNOSIS — E042 Nontoxic multinodular goiter: Secondary | ICD-10-CM | POA: Diagnosis not present

## 2023-01-31 DIAGNOSIS — N3281 Overactive bladder: Secondary | ICD-10-CM | POA: Diagnosis not present

## 2023-01-31 DIAGNOSIS — Z6834 Body mass index (BMI) 34.0-34.9, adult: Secondary | ICD-10-CM | POA: Diagnosis not present

## 2023-01-31 DIAGNOSIS — Z01419 Encounter for gynecological examination (general) (routine) without abnormal findings: Secondary | ICD-10-CM | POA: Diagnosis not present

## 2023-02-01 ENCOUNTER — Other Ambulatory Visit: Payer: Self-pay | Admitting: Obstetrics and Gynecology

## 2023-02-01 DIAGNOSIS — E0590 Thyrotoxicosis, unspecified without thyrotoxic crisis or storm: Secondary | ICD-10-CM | POA: Diagnosis not present

## 2023-02-01 DIAGNOSIS — Z800 Family history of malignant neoplasm of digestive organs: Secondary | ICD-10-CM

## 2023-03-29 DIAGNOSIS — E0590 Thyrotoxicosis, unspecified without thyrotoxic crisis or storm: Secondary | ICD-10-CM | POA: Diagnosis not present

## 2023-04-05 ENCOUNTER — Other Ambulatory Visit: Payer: Self-pay | Admitting: Nurse Practitioner

## 2023-04-05 DIAGNOSIS — E042 Nontoxic multinodular goiter: Secondary | ICD-10-CM

## 2023-04-05 DIAGNOSIS — J329 Chronic sinusitis, unspecified: Secondary | ICD-10-CM | POA: Diagnosis not present

## 2023-05-03 ENCOUNTER — Ambulatory Visit
Admission: RE | Admit: 2023-05-03 | Discharge: 2023-05-03 | Disposition: A | Discharge status: Home / Self Care | Payer: No Typology Code available for payment source | Source: Ambulatory Visit | Attending: Obstetrics and Gynecology | Admitting: Obstetrics and Gynecology

## 2023-05-03 DIAGNOSIS — Z800 Family history of malignant neoplasm of digestive organs: Secondary | ICD-10-CM

## 2023-05-03 MED ORDER — GADOPICLENOL 0.5 MMOL/ML IV SOLN
10.0000 mL | Freq: Once | INTRAVENOUS | Status: AC | PRN
  Administered 2023-05-03: 10 mL via INTRAVENOUS

## 2023-06-10 IMAGING — CT CT ABD-PELV W/ CM
2 of 5 series · 17 of 46 positions shown, 19 images · IV contrast (Omnipaque)
Comparison: None.

CLINICAL DATA: Abdominal bloating and diarrhea for several months.
Positive family history of pancreatic carcinoma.

EXAM:
CT ABDOMEN AND PELVIS WITH CONTRAST
TECHNIQUE: Multidetector CT imaging of the abdomen and pelvis was performed
using the standard protocol following bolus administration of
intravenous contrast.
CONTRAST:  100mL OMNIPAQUE IOHEXOL 300 MG/ML  SOLN

[Series 2: axial st · axial · 0.98mm/px · z∈[-454,-34]mm · 14 of 94 slices shown, 16 images]
[im 5/94  soft-tissue]
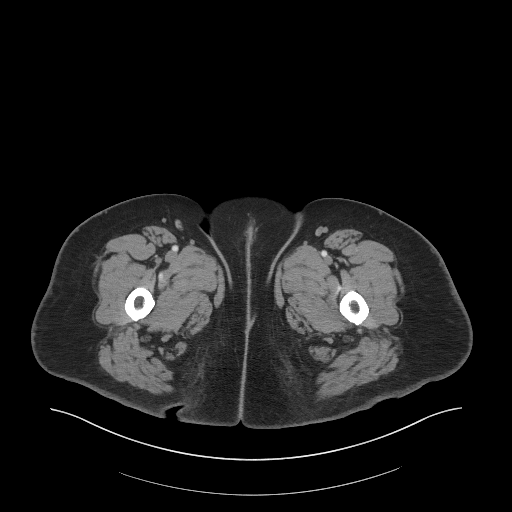
[im 5/94  bone]
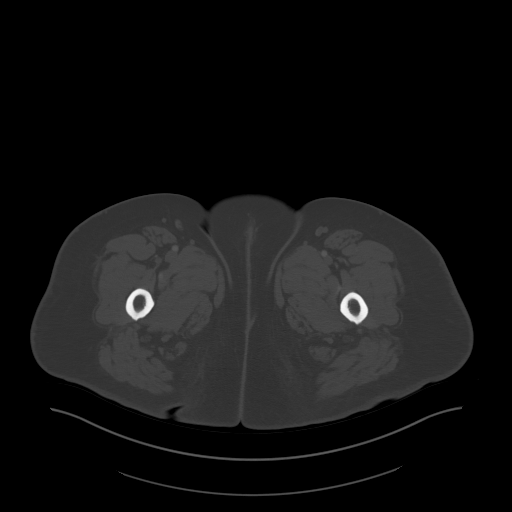
[im 10/94  soft-tissue]
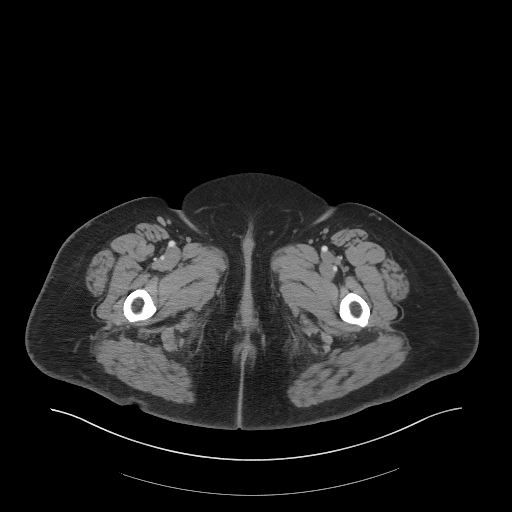
[im 20/94  soft-tissue]
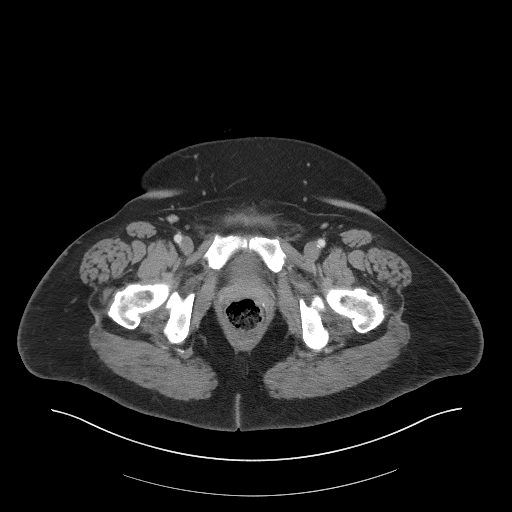
[im 25/94  soft-tissue]
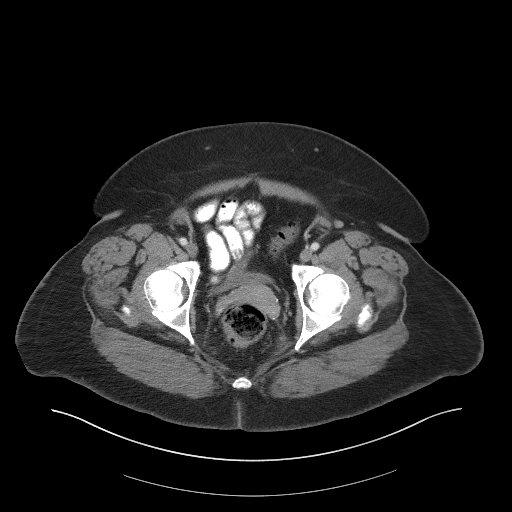
[im 30/94  soft-tissue]
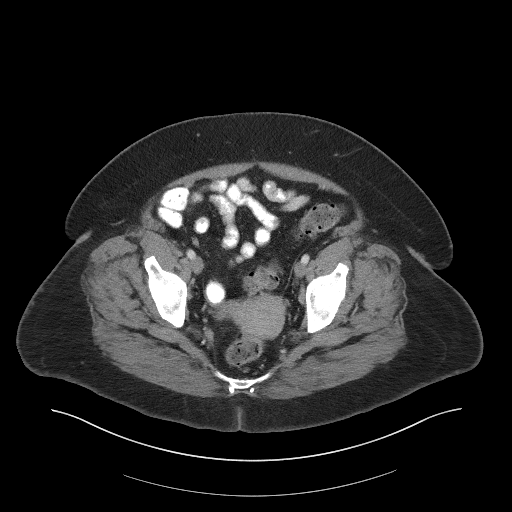
[im 40/94  soft-tissue]
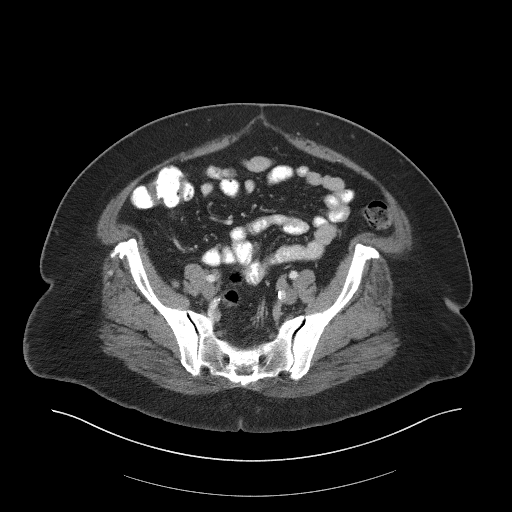
[im 45/94  soft-tissue]
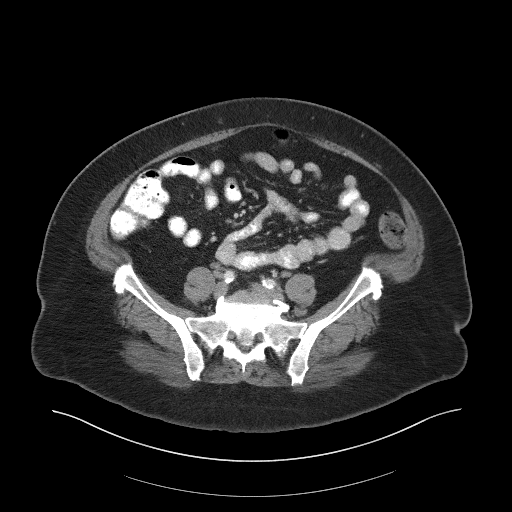
[im 49/94  soft-tissue]
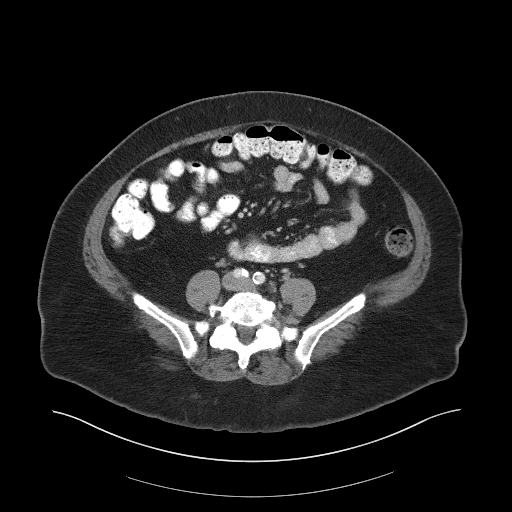
[im 54/94  soft-tissue]
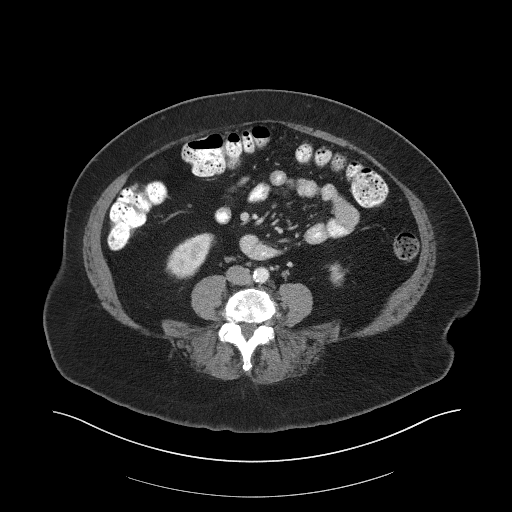
[im 54/94  bone]
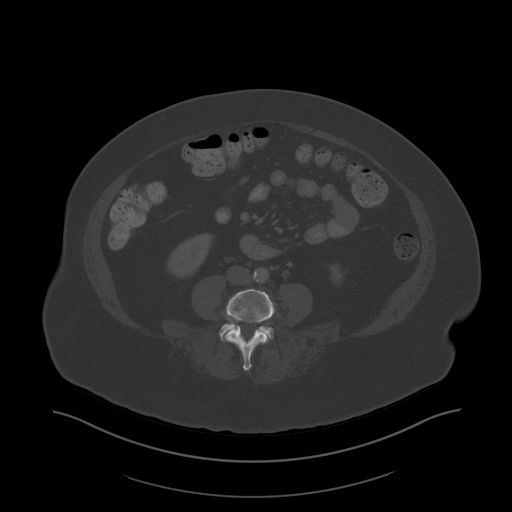
[im 64/94  soft-tissue]
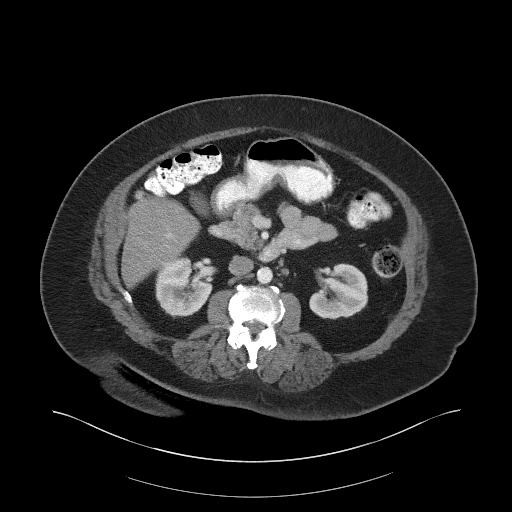
[im 69/94  soft-tissue]
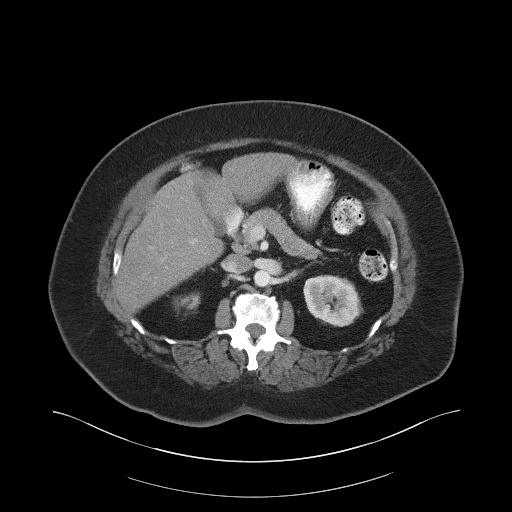
[im 74/94  soft-tissue]
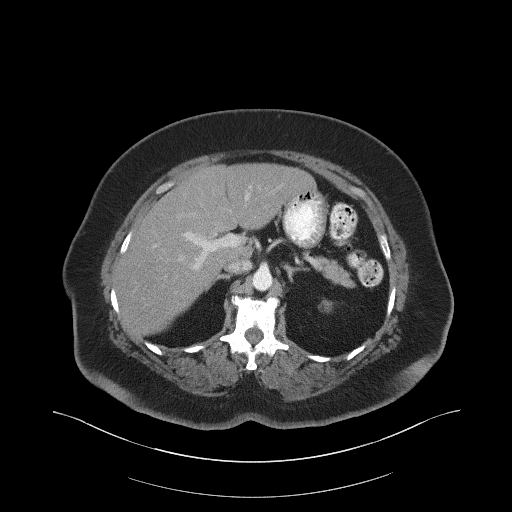
[im 84/94  soft-tissue]
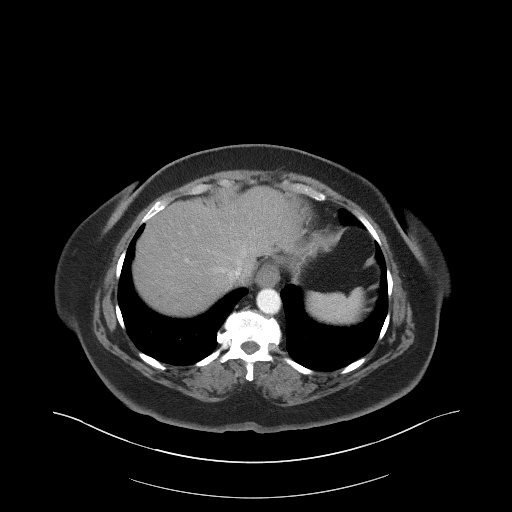
[im 89/94  soft-tissue]
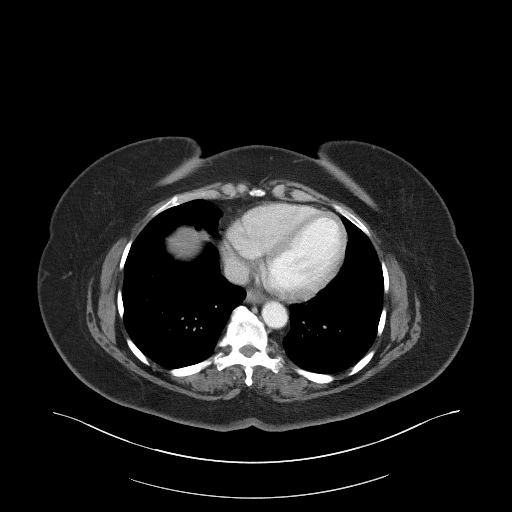

[Series 5: coronal st · coronal · 0.87mm/px · 3 of 112 slices shown]
[im 38/112  soft-tissue]
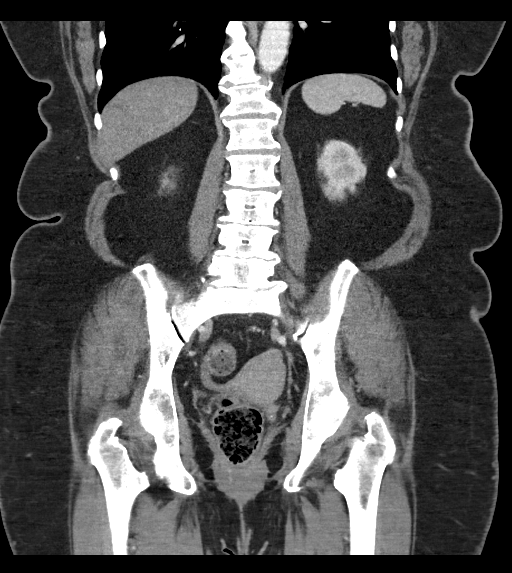
[im 50/112  soft-tissue]
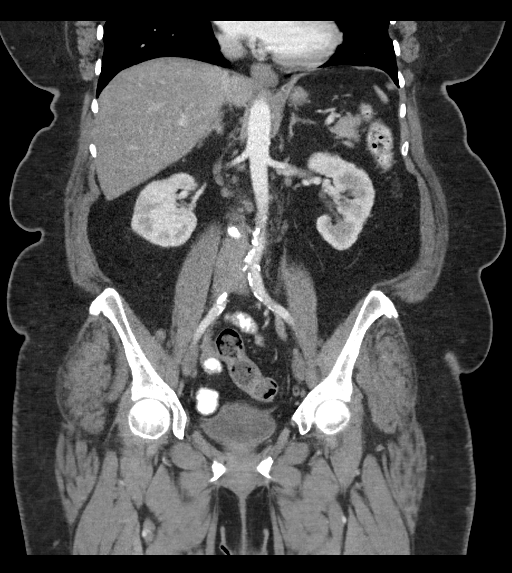
[im 62/112  soft-tissue]
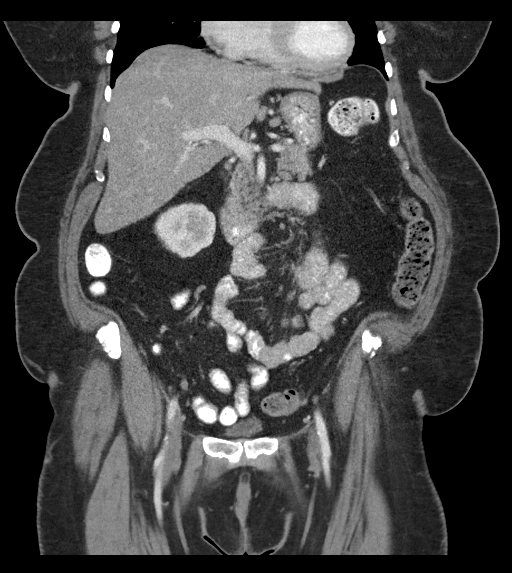

[17 of 46 positions shown; findings below may reference images not displayed]

FINDINGS: Lower Chest: No acute findings.

Hepatobiliary: No hepatic masses identified. Sub-cm cyst seen in the
central left lobe near the hepatic venous confluence.
Mild-to-moderate diffuse hepatic steatosis noted. Gallbladder is
unremarkable. No evidence of biliary ductal dilatation.

Pancreas: Normal appearance. No evidence of pancreatic mass or
ductal dilatation. No evidence of peripancreatic inflammatory
changes or fluid collections.

Spleen: Within normal limits in size and appearance.

Adrenals/Urinary Tract: 2 small benign-appearing cyst noted in the
right kidney. No masses identified. No evidence of ureteral calculi
or hydronephrosis.

Stomach/Bowel: No evidence of obstruction, inflammatory process or
abnormal fluid collections.

Vascular/Lymphatic: No pathologically enlarged lymph nodes. No acute
vascular findings.

Reproductive:  No mass or other significant abnormality.

Other:  None.

Musculoskeletal:  No suspicious bone lesions identified.
IMPRESSION: No acute findings.

No evidence of pancreatic mass.

Hepatic steatosis.

## 2023-08-02 ENCOUNTER — Ambulatory Visit
Admission: RE | Admit: 2023-08-02 | Discharge: 2023-08-02 | Disposition: A | Discharge status: Home / Self Care | Payer: PRIVATE HEALTH INSURANCE | Source: Ambulatory Visit | Attending: Nurse Practitioner | Admitting: Nurse Practitioner

## 2023-08-02 DIAGNOSIS — E0590 Thyrotoxicosis, unspecified without thyrotoxic crisis or storm: Secondary | ICD-10-CM | POA: Diagnosis not present

## 2023-08-02 DIAGNOSIS — Z9889 Other specified postprocedural states: Secondary | ICD-10-CM | POA: Diagnosis not present

## 2023-08-02 DIAGNOSIS — E042 Nontoxic multinodular goiter: Secondary | ICD-10-CM

## 2023-08-10 DIAGNOSIS — E0590 Thyrotoxicosis, unspecified without thyrotoxic crisis or storm: Secondary | ICD-10-CM | POA: Diagnosis not present

## 2023-08-10 DIAGNOSIS — E042 Nontoxic multinodular goiter: Secondary | ICD-10-CM | POA: Diagnosis not present

## 2023-09-16 DIAGNOSIS — I10 Essential (primary) hypertension: Secondary | ICD-10-CM | POA: Diagnosis not present

## 2023-09-16 DIAGNOSIS — E669 Obesity, unspecified: Secondary | ICD-10-CM | POA: Diagnosis not present

## 2023-09-16 DIAGNOSIS — N1831 Chronic kidney disease, stage 3a: Secondary | ICD-10-CM | POA: Diagnosis not present

## 2023-09-16 DIAGNOSIS — E1122 Type 2 diabetes mellitus with diabetic chronic kidney disease: Secondary | ICD-10-CM | POA: Diagnosis not present

## 2023-09-21 DIAGNOSIS — E0590 Thyrotoxicosis, unspecified without thyrotoxic crisis or storm: Secondary | ICD-10-CM | POA: Diagnosis not present

## 2023-09-22 IMAGING — US US THYROID
1 series · 13 of 25 positions shown · non-contrast
Comparison: 06/15/2018

CLINICAL DATA: Multinodular goiter

Thyroid nodule follow-up
EXAM:
THYROID ULTRASOUND
TECHNIQUE: Ultrasound examination of the thyroid gland and adjacent soft
tissues was performed.

[Series 1: us thyroid · 0.06mm/px · 13 of 54 slices shown]
[im 1/54]
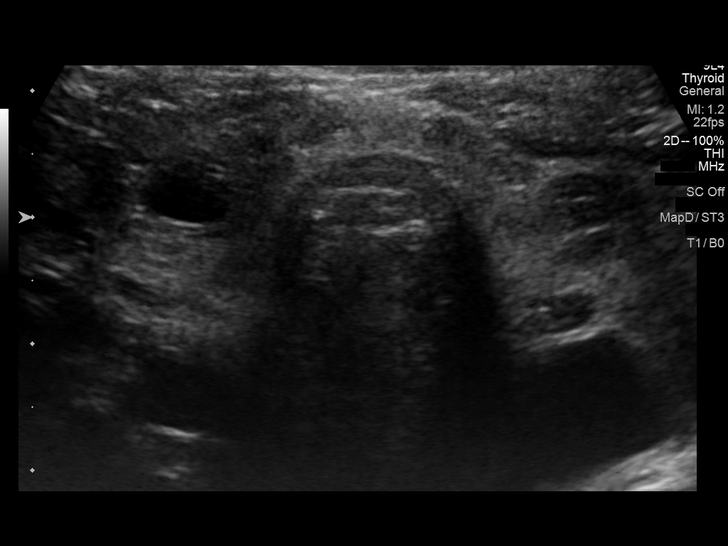
[im 5/54]
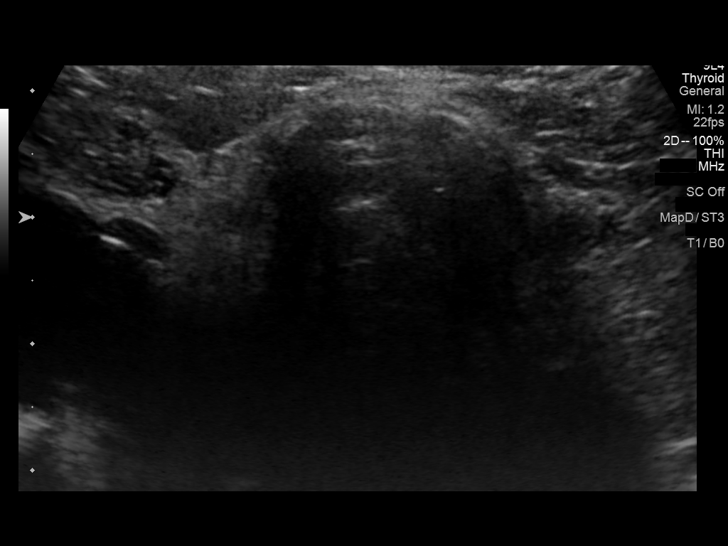
[im 9/54]
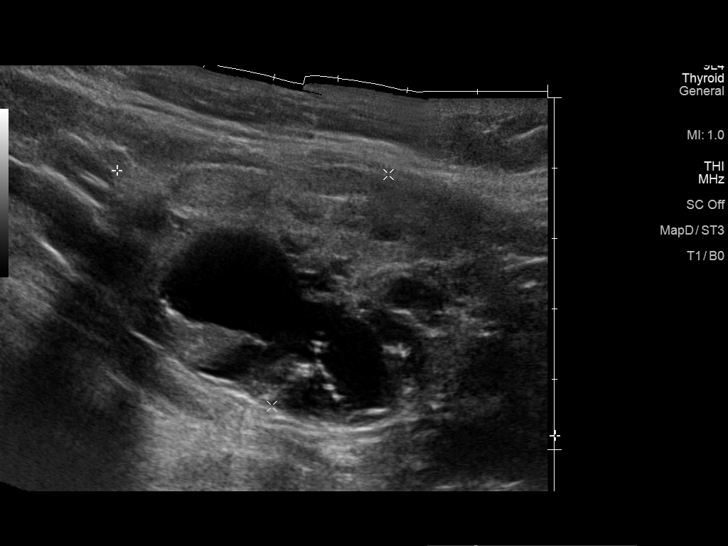
[im 14/54]
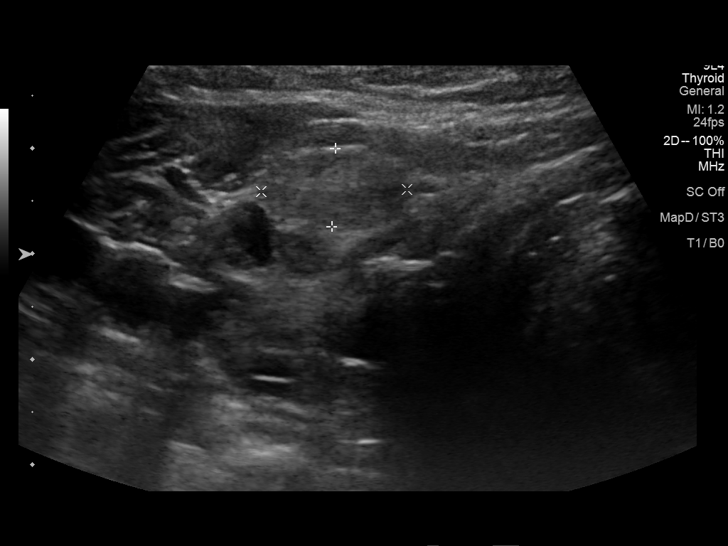
[im 18/54]
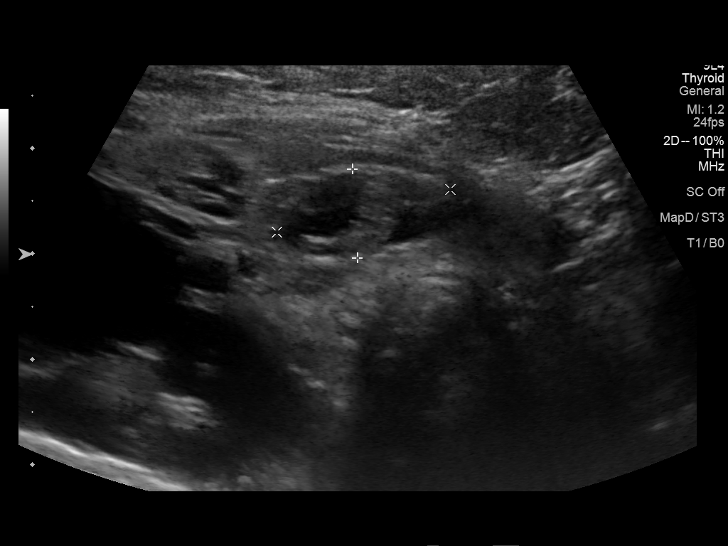
[im 23/54]
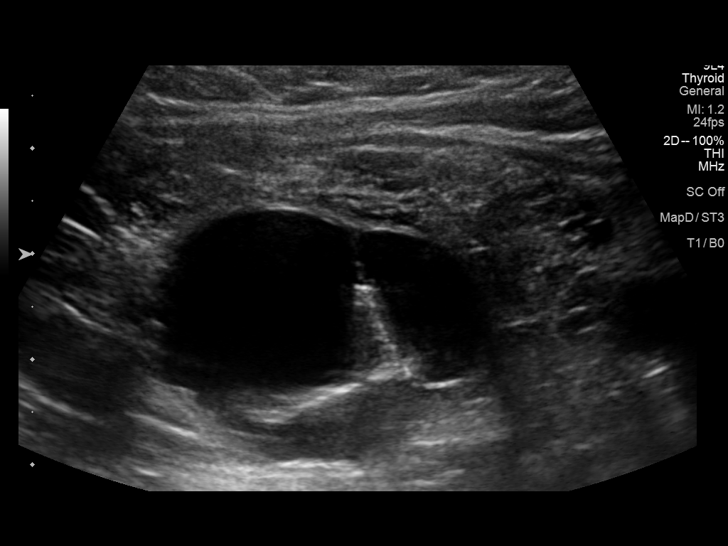
[im 27/54]
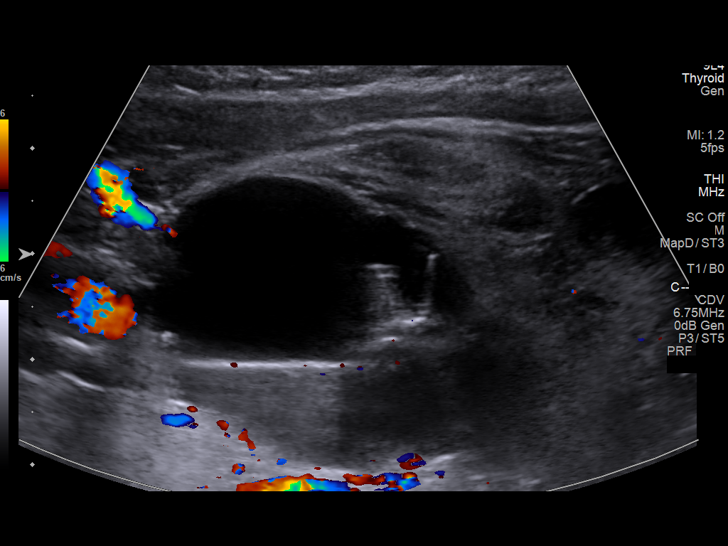
[im 31/54]
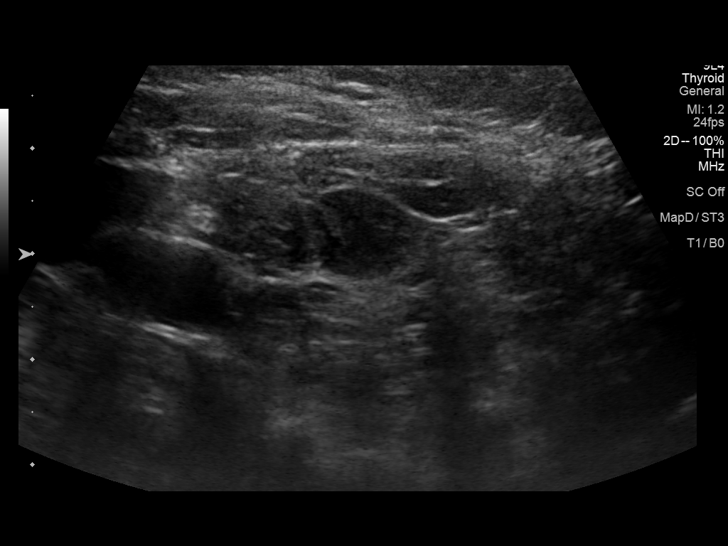
[im 36/54]
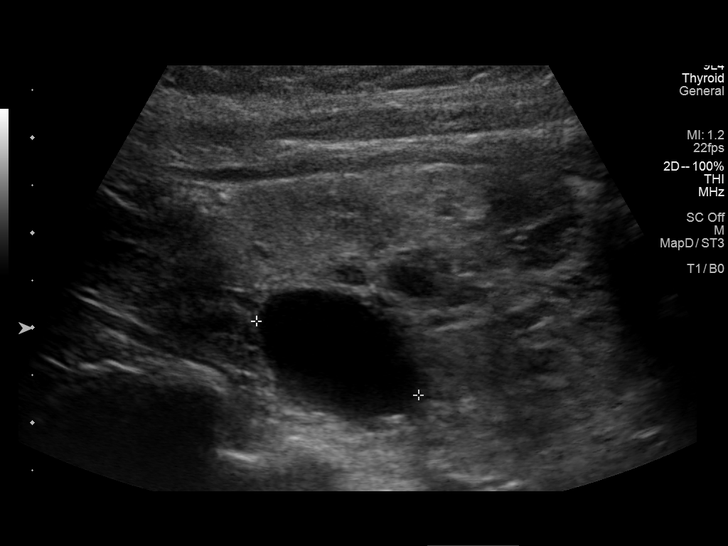
[im 40/54]
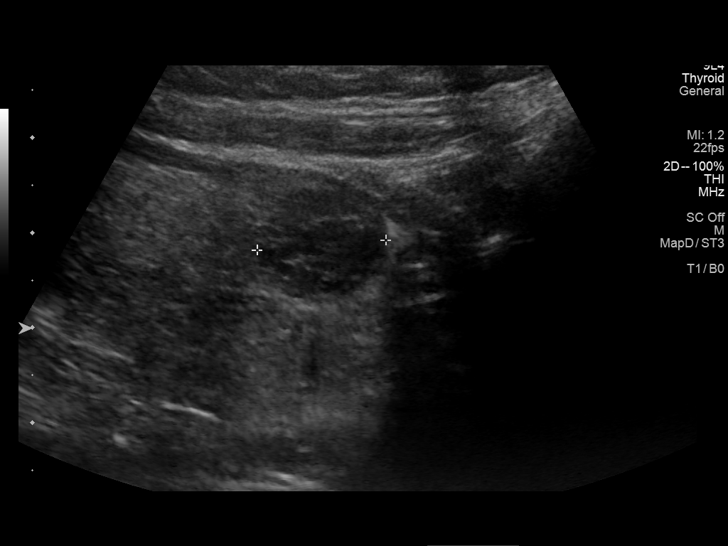
[im 45/54]
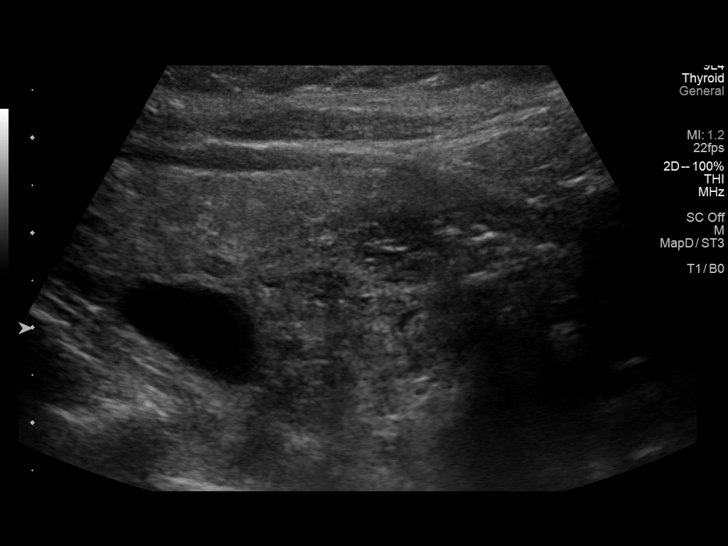
[im 49/54]
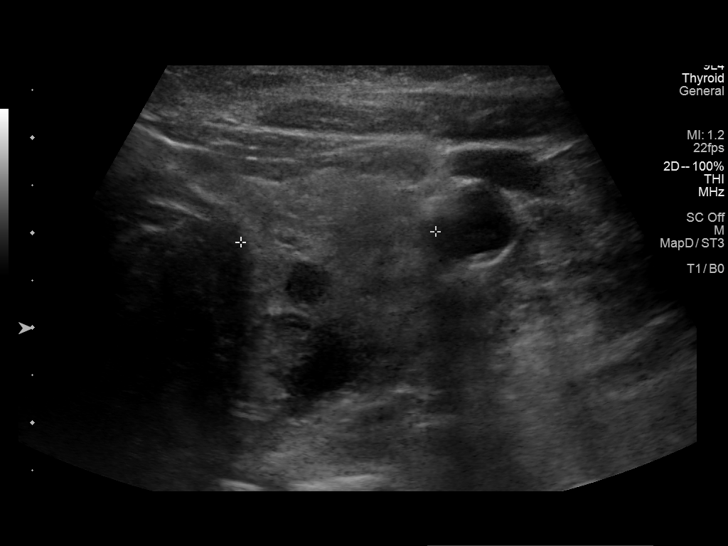
[im 54/54]
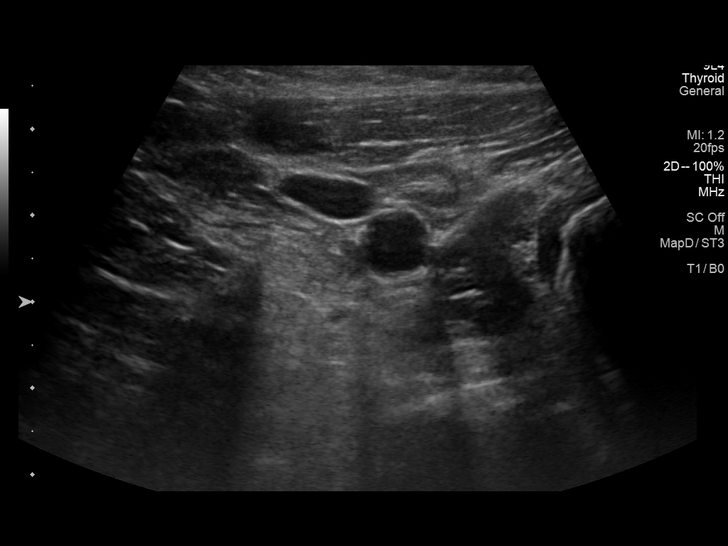

[13 of 25 positions shown; findings below may reference images not displayed]

FINDINGS: Parenchymal Echotexture: Moderately heterogenous

Isthmus: 0.5 cm

Right lobe: 7.3 x 3.7 x 3.4 cm

Left lobe: 5.7 x 3.7 x 2.1 cm

_________________________________________________________

Estimated total number of nodules >/= 1 cm: 7

Number of spongiform nodules >/=  2 cm not described below (TR1): 0

Number of mixed cystic and solid nodules >/= 1.5 cm not described
below (TR2): 0

_________________________________________________________

Nodule 1: 1.0 x 1.0 x 0.7 cm cystic nodule in the isthmus does not
meet criteria for FNA or imaging follow-up.

_________________________________________________________

Nodule # 2: 1.5 x 1.4 x 0.7 cm solid isoechoic right superior
thyroid nodule demonstrates very minimal growth since 03/25/2014
where it measured 1.1 x 1.1 x 0.9 cm. This favors a benign etiology.

_________________________________________________________

Nodule 3: Mixed solid cystic isoechoic right mid thyroid nodule is
not significant changed in size since prior examination and does not
meet criteria for FNA or imaging surveillance.

_________________________________________________________

Nodule 4: Spongiform nodule in the inferior right thyroid lobe
measuring 1.7 x 1.7 x 0.8 cm does not meet criteria for FNA or
imaging surveillance.

_________________________________________________________

Nodule 5: 1.9 x 1.4 x 1.2 cm cystic left thyroid nodule does not
meet criteria for FNA or imaging follow-up.

_________________________________________________________

Nodule 6: 1.3 x 1.3 x 0.6 cm mixed solid cystic isoechoic nodule
does not meet criteria for FNA or imaging follow-up.

_________________________________________________________

Nodule 7: 1.4 x 1.4 x 1.3 cm solid hypoechoic nodule in the mid left
thyroid lobe is not significantly changed in size dating back to
08/06/2013 where it measured 1.3 x 1.4 x 1.2 cm. Stability since the
4249 examination indicates a benign etiology.
IMPRESSION: Multiple bilateral thyroid nodules again seen. These nodules are
either stable dating back to 4249 or do not meet criteria for FNA or
imaging surveillance. No nodules identified which meet criteria for
FNA or imaging surveillance.

The above is in keeping with the ACR TI-RADS recommendations - [HOSPITAL] 8975;[DATE].

## 2023-09-27 DIAGNOSIS — Z008 Encounter for other general examination: Secondary | ICD-10-CM | POA: Diagnosis not present

## 2023-09-27 DIAGNOSIS — G4733 Obstructive sleep apnea (adult) (pediatric): Secondary | ICD-10-CM | POA: Diagnosis not present

## 2023-10-16 DIAGNOSIS — N1831 Chronic kidney disease, stage 3a: Secondary | ICD-10-CM | POA: Diagnosis not present

## 2023-10-16 DIAGNOSIS — E669 Obesity, unspecified: Secondary | ICD-10-CM | POA: Diagnosis not present

## 2023-10-16 DIAGNOSIS — I10 Essential (primary) hypertension: Secondary | ICD-10-CM | POA: Diagnosis not present

## 2023-10-16 DIAGNOSIS — E1122 Type 2 diabetes mellitus with diabetic chronic kidney disease: Secondary | ICD-10-CM | POA: Diagnosis not present

## 2023-11-16 DIAGNOSIS — I10 Essential (primary) hypertension: Secondary | ICD-10-CM | POA: Diagnosis not present

## 2023-11-16 DIAGNOSIS — E1122 Type 2 diabetes mellitus with diabetic chronic kidney disease: Secondary | ICD-10-CM | POA: Diagnosis not present

## 2023-11-16 DIAGNOSIS — E669 Obesity, unspecified: Secondary | ICD-10-CM | POA: Diagnosis not present

## 2023-11-16 DIAGNOSIS — N1831 Chronic kidney disease, stage 3a: Secondary | ICD-10-CM | POA: Diagnosis not present

## 2023-11-17 DIAGNOSIS — N1831 Chronic kidney disease, stage 3a: Secondary | ICD-10-CM | POA: Diagnosis not present

## 2023-11-17 DIAGNOSIS — E1122 Type 2 diabetes mellitus with diabetic chronic kidney disease: Secondary | ICD-10-CM | POA: Diagnosis not present

## 2023-11-17 DIAGNOSIS — I10 Essential (primary) hypertension: Secondary | ICD-10-CM | POA: Diagnosis not present

## 2023-11-17 DIAGNOSIS — Z1331 Encounter for screening for depression: Secondary | ICD-10-CM | POA: Diagnosis not present

## 2023-11-17 DIAGNOSIS — E7800 Pure hypercholesterolemia, unspecified: Secondary | ICD-10-CM | POA: Diagnosis not present

## 2023-11-17 DIAGNOSIS — Z6835 Body mass index (BMI) 35.0-35.9, adult: Secondary | ICD-10-CM | POA: Diagnosis not present

## 2023-11-17 DIAGNOSIS — E0590 Thyrotoxicosis, unspecified without thyrotoxic crisis or storm: Secondary | ICD-10-CM | POA: Diagnosis not present

## 2023-11-17 DIAGNOSIS — G4733 Obstructive sleep apnea (adult) (pediatric): Secondary | ICD-10-CM | POA: Diagnosis not present

## 2023-11-17 DIAGNOSIS — M109 Gout, unspecified: Secondary | ICD-10-CM | POA: Diagnosis not present

## 2023-11-17 DIAGNOSIS — Z0000 Encounter for general adult medical examination without abnormal findings: Secondary | ICD-10-CM | POA: Diagnosis not present

## 2023-11-17 DIAGNOSIS — K219 Gastro-esophageal reflux disease without esophagitis: Secondary | ICD-10-CM | POA: Diagnosis not present

## 2023-12-12 DIAGNOSIS — E0590 Thyrotoxicosis, unspecified without thyrotoxic crisis or storm: Secondary | ICD-10-CM | POA: Diagnosis not present

## 2023-12-17 DIAGNOSIS — N1831 Chronic kidney disease, stage 3a: Secondary | ICD-10-CM | POA: Diagnosis not present

## 2023-12-17 DIAGNOSIS — E669 Obesity, unspecified: Secondary | ICD-10-CM | POA: Diagnosis not present

## 2023-12-17 DIAGNOSIS — I10 Essential (primary) hypertension: Secondary | ICD-10-CM | POA: Diagnosis not present

## 2023-12-17 DIAGNOSIS — E1122 Type 2 diabetes mellitus with diabetic chronic kidney disease: Secondary | ICD-10-CM | POA: Diagnosis not present

## 2023-12-19 DIAGNOSIS — E1165 Type 2 diabetes mellitus with hyperglycemia: Secondary | ICD-10-CM | POA: Diagnosis not present

## 2023-12-19 DIAGNOSIS — I10 Essential (primary) hypertension: Secondary | ICD-10-CM | POA: Diagnosis not present

## 2023-12-19 DIAGNOSIS — E785 Hyperlipidemia, unspecified: Secondary | ICD-10-CM | POA: Diagnosis not present

## 2023-12-19 DIAGNOSIS — E042 Nontoxic multinodular goiter: Secondary | ICD-10-CM | POA: Diagnosis not present

## 2023-12-19 DIAGNOSIS — E0590 Thyrotoxicosis, unspecified without thyrotoxic crisis or storm: Secondary | ICD-10-CM | POA: Diagnosis not present

## 2024-01-16 DIAGNOSIS — I10 Essential (primary) hypertension: Secondary | ICD-10-CM | POA: Diagnosis not present

## 2024-01-16 DIAGNOSIS — E669 Obesity, unspecified: Secondary | ICD-10-CM | POA: Diagnosis not present

## 2024-01-16 DIAGNOSIS — N1831 Chronic kidney disease, stage 3a: Secondary | ICD-10-CM | POA: Diagnosis not present

## 2024-01-16 DIAGNOSIS — E1122 Type 2 diabetes mellitus with diabetic chronic kidney disease: Secondary | ICD-10-CM | POA: Diagnosis not present

## 2024-05-20 ENCOUNTER — Other Ambulatory Visit: Payer: Self-pay | Admitting: Obstetrics and Gynecology

## 2024-05-20 DIAGNOSIS — Z800 Family history of malignant neoplasm of digestive organs: Secondary | ICD-10-CM

## 2024-05-22 ENCOUNTER — Other Ambulatory Visit

## 2024-06-18 ENCOUNTER — Other Ambulatory Visit
